# Patient Record
Sex: Female | Born: 1990
Health system: Southern US, Community
[De-identification: ages and names within clinical notes are randomized; demographics above are authoritative.]

## PROBLEM LIST (undated history)

## (undated) DIAGNOSIS — Z789 Other specified health status: Secondary | ICD-10-CM

## (undated) DIAGNOSIS — N289 Disorder of kidney and ureter, unspecified: Secondary | ICD-10-CM

## (undated) DIAGNOSIS — N390 Urinary tract infection, site not specified: Secondary | ICD-10-CM

## (undated) HISTORY — PX: NO PAST SURGERIES: SHX2092

---
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
## 2012-03-18 LAB — OB RESULTS CONSOLE HEPATITIS B SURFACE ANTIGEN: Hepatitis B Surface Ag: NEGATIVE

## 2012-03-18 LAB — OB RESULTS CONSOLE HIV ANTIBODY (ROUTINE TESTING): HIV: NONREACTIVE

## 2012-03-24 NOTE — L&D Delivery Note (Signed)
Delivery Note At 4:25 AM a viable and healthy female was delivered via Vaginal, Spontaneous Delivery (Presentation: ; Occiput Anterior).  APGAR: 8, 9; weight 7 lb 1.4 oz (3215 g).   Placenta status: Intact, Spontaneous Pathology. Short  To path  Cord CANx 1 not reducible: 3 vessels with the following complications: None.  Cord pH: none  Anesthesia: Epidural  Episiotomy: None Lacerations: None Suture Repair: n/a Est. Blood Loss (mL): 300  Mom to postpartum.  Baby to nursery-stable.  Yvette Perez A 09/11/2012, 4:49 AM

## 2012-06-23 LAB — OB RESULTS CONSOLE GBS: GBS: NEGATIVE

## 2012-06-23 LAB — OB RESULTS CONSOLE RUBELLA ANTIBODY, IGM: Rubella: IMMUNE

## 2012-06-23 LAB — OB RESULTS CONSOLE ANTIBODY SCREEN: Antibody Screen: NEGATIVE

## 2012-06-23 LAB — OB RESULTS CONSOLE ABO/RH: RH Type: POSITIVE

## 2012-06-23 LAB — OB RESULTS CONSOLE RPR: RPR: NONREACTIVE

## 2012-09-10 ENCOUNTER — Encounter (HOSPITAL_COMMUNITY): Payer: Self-pay | Admitting: Anesthesiology

## 2012-09-10 ENCOUNTER — Inpatient Hospital Stay (HOSPITAL_COMMUNITY)
Admission: AD | Admit: 2012-09-10 | Discharge: 2012-09-12 | DRG: 373 | Disposition: A | Discharge status: Home / Self Care | Payer: BC Managed Care – PPO | Source: Ambulatory Visit | Attending: Obstetrics and Gynecology | Admitting: Obstetrics and Gynecology

## 2012-09-10 ENCOUNTER — Encounter (HOSPITAL_COMMUNITY): Payer: Self-pay | Admitting: *Deleted

## 2012-09-10 ENCOUNTER — Inpatient Hospital Stay (HOSPITAL_COMMUNITY): Payer: BC Managed Care – PPO | Admitting: Anesthesiology

## 2012-09-10 HISTORY — DX: Other specified health status: Z78.9

## 2012-09-10 LAB — CBC
MCH: 28.4 pg (ref 26.0–34.0)
MCHC: 33.6 g/dL (ref 30.0–36.0)
MCV: 84.6 fL (ref 78.0–100.0)
Platelets: 123 10*3/uL — ABNORMAL LOW (ref 150–400)
RBC: 4.47 MIL/uL (ref 3.87–5.11)

## 2012-09-10 MED ORDER — LACTATED RINGERS IV SOLN
INTRAVENOUS | Status: DC
  Administered 2012-09-10 – 2012-09-11 (×2): via INTRAVENOUS

## 2012-09-10 MED ORDER — IBUPROFEN 600 MG PO TABS: 600.0000 mg | ORAL_TABLET | Freq: Four times a day (QID) | ORAL | Status: DC | PRN

## 2012-09-10 MED ORDER — FENTANYL 2.5 MCG/ML BUPIVACAINE 1/10 % EPIDURAL INFUSION (WH - ANES)
14.0000 mL/h | INTRAMUSCULAR | Status: DC | PRN
  Administered 2012-09-10: 14 mL/h via EPIDURAL
  Filled 2012-09-10: qty 125

## 2012-09-10 MED ORDER — LACTATED RINGERS IV SOLN: 500.0000 mL | INTRAVENOUS | Status: DC | PRN

## 2012-09-10 MED ORDER — PHENYLEPHRINE 40 MCG/ML (10ML) SYRINGE FOR IV PUSH (FOR BLOOD PRESSURE SUPPORT)
80.0000 ug | PREFILLED_SYRINGE | INTRAVENOUS | Status: DC | PRN
  Filled 2012-09-10: qty 2

## 2012-09-10 MED ORDER — OXYTOCIN 40 UNITS IN LACTATED RINGERS INFUSION - SIMPLE MED: 62.5000 mL/h | INTRAVENOUS | Status: DC

## 2012-09-10 MED ORDER — PHENYLEPHRINE 40 MCG/ML (10ML) SYRINGE FOR IV PUSH (FOR BLOOD PRESSURE SUPPORT)
80.0000 ug | PREFILLED_SYRINGE | INTRAVENOUS | Status: DC | PRN
  Filled 2012-09-10: qty 2
  Filled 2012-09-10: qty 5

## 2012-09-10 MED ORDER — CITRIC ACID-SODIUM CITRATE 334-500 MG/5ML PO SOLN: 30.0000 mL | ORAL | Status: DC | PRN

## 2012-09-10 MED ORDER — EPHEDRINE 5 MG/ML INJ
10.0000 mg | INTRAVENOUS | Status: DC | PRN
  Filled 2012-09-10: qty 2

## 2012-09-10 MED ORDER — LIDOCAINE HCL (PF) 1 % IJ SOLN
30.0000 mL | INTRAMUSCULAR | Status: DC | PRN
  Filled 2012-09-10 (×2): qty 30

## 2012-09-10 MED ORDER — LACTATED RINGERS IV SOLN
INTRAVENOUS | Status: DC
  Administered 2012-09-10 – 2012-09-11 (×2): via INTRAUTERINE

## 2012-09-10 MED ORDER — TERBUTALINE SULFATE 1 MG/ML IJ SOLN: 0.2500 mg | Freq: Once | INTRAMUSCULAR | Status: AC | PRN

## 2012-09-10 MED ORDER — OXYCODONE-ACETAMINOPHEN 5-325 MG PO TABS: 1.0000 | ORAL_TABLET | ORAL | Status: DC | PRN

## 2012-09-10 MED ORDER — EPHEDRINE 5 MG/ML INJ
10.0000 mg | INTRAVENOUS | Status: DC | PRN
  Filled 2012-09-10: qty 4
  Filled 2012-09-10: qty 2

## 2012-09-10 MED ORDER — OXYTOCIN BOLUS FROM INFUSION: 500.0000 mL | INTRAVENOUS | Status: DC

## 2012-09-10 MED ORDER — DIPHENHYDRAMINE HCL 50 MG/ML IJ SOLN: 12.5000 mg | INTRAMUSCULAR | Status: DC | PRN

## 2012-09-10 MED ORDER — ONDANSETRON HCL 4 MG/2ML IJ SOLN: 4.0000 mg | Freq: Four times a day (QID) | INTRAMUSCULAR | Status: DC | PRN

## 2012-09-10 MED ORDER — ACETAMINOPHEN 325 MG PO TABS: 650.0000 mg | ORAL_TABLET | ORAL | Status: DC | PRN

## 2012-09-10 MED ORDER — FLEET ENEMA 7-19 GM/118ML RE ENEM: 1.0000 | ENEMA | RECTAL | Status: DC | PRN

## 2012-09-10 MED ORDER — LIDOCAINE HCL (PF) 1 % IJ SOLN
INTRAMUSCULAR | Status: DC | PRN
  Administered 2012-09-10 (×2): 5 mL

## 2012-09-10 MED ORDER — LACTATED RINGERS IV SOLN: 500.0000 mL | Freq: Once | INTRAVENOUS | Status: DC

## 2012-09-10 MED ORDER — OXYTOCIN 40 UNITS IN LACTATED RINGERS INFUSION - SIMPLE MED
1.0000 m[IU]/min | INTRAVENOUS | Status: DC
  Administered 2012-09-10: 2 m[IU]/min via INTRAVENOUS
  Administered 2012-09-10: 1 m[IU]/min via INTRAVENOUS
  Administered 2012-09-10: 6 m[IU]/min via INTRAVENOUS
  Filled 2012-09-10: qty 1000

## 2012-09-10 NOTE — Progress Notes (Signed)
Yvette Perez is Perez 22 y.o. G1P0 at [redacted]w[redacted]d by ultrasound admitted for rupture of membranes  Subjective: No chief complaint on file.  No complaint. Denies pelvic pressure Objective: BP 127/96  Pulse 70  Temp(Src) 97.7 F (36.5 C) (Oral)  Resp 20  Ht 5\' 2"  (1.575 m)  Wt 59.421 kg (131 lb)  BMI 23.95 kg/m2     Pitocin off due to fetal heart rate decel when pitocin was restarted  FHT:  FHR: 150 bpm, variability: moderate,  accelerations:  Present,  decelerations:  Present occ variable decel UC:   irregular, every 2-3 minutes SVE:   4/80/-2 some edema ant lip Labs: Lab Results  Component Value Date   WBC 8.2 09/10/2012   HGB 12.7 09/10/2012   HCT 37.8 09/10/2012   MCV 84.6 09/10/2012   PLT 123* 09/10/2012    Assessment / Plan: Arrest in active phase of labor  P) will cont exaggerated sims position. Hold on pitocin Anticipated MOD:  Poss C/S  Yvette Perez 09/10/2012, 11:26 PM

## 2012-09-10 NOTE — Anesthesia Procedure Notes (Signed)
Epidural Patient location during procedure: OB Start time: 09/10/2012 8:10 PM  Staffing Anesthesiologist: Angus Seller., Harrell Gave. Performed by: anesthesiologist   Preanesthetic Checklist Completed: patient identified, site marked, surgical consent, pre-op evaluation, timeout performed, IV checked, risks and benefits discussed and monitors and equipment checked  Epidural Patient position: sitting Prep: site prepped and draped and DuraPrep Patient monitoring: continuous pulse ox and blood pressure Approach: midline Injection technique: LOR air and LOR saline  Needle:  Needle type: Tuohy  Needle gauge: 17 G Needle length: 9 cm and 9 Needle insertion depth: 4 cm Catheter type: closed end flexible Catheter size: 19 Gauge Catheter at skin depth: 10 cm Test dose: negative  Assessment Events: blood not aspirated, injection not painful, no injection resistance, negative IV test and no paresthesia  Additional Notes Patient identified.  Risk benefits discussed including failed block, incomplete pain control, headache, nerve damage, paralysis, blood pressure changes, nausea, vomiting, reactions to medication both toxic or allergic, and postpartum back pain.  Patient expressed understanding and wished to proceed.  All questions were answered.  Sterile technique used throughout procedure and epidural site dressed with sterile barrier dressing. No paresthesia or other complications noted.The patient did not experience any signs of intravascular injection such as tinnitus or metallic taste in mouth nor signs of intrathecal spread such as rapid motor block. Please see nursing notes for vital signs.

## 2012-09-10 NOTE — H&P (Signed)
Yvette Perez is a 22 y.o. female presenting for admission @ 38 6/7 weeks due to SROM, early labor. GBS cx neg Pt was seen in office with c/o greenish mucoid discharge.? Leaking fluid. (+) ctx q 7 mins Maternal Medical History:  Reason for admission: Rupture of membranes and contractions.   Contractions: Onset was 6-12 hours ago.   Frequency: irregular.   Perceived severity is moderate.    Fetal activity: Perceived fetal activity is normal.    Prenatal complications: Poor weight gain  Prenatal Complications - Diabetes: none.    OB History   Grav Para Term Preterm Abortions TAB SAB Ect Mult Living   1              Past Medical History  Diagnosis Date  . Medical history non-contributory    Past Surgical History  Procedure Laterality Date  . No past surgeries     Family History: family history is not on file. Social History:  reports that she has never smoked. She has never used smokeless tobacco. She reports that she does not drink alcohol or use illicit drugs.   Prenatal Transfer Tool  Maternal Diabetes: No Genetic Screening: Normal Maternal Ultrasounds/Referrals: Normal Fetal Ultrasounds or other Referrals:  None Maternal Substance Abuse:  No Significant Maternal Medications:  None Significant Maternal Lab Results:  Lab values include: Group B Strep negative Other Comments:  None  ROS neg  Dilation: 3 Effacement (%): 80 Station: -2 Exam by:: SH RNC Blood pressure 144/88, pulse 85, temperature 98.3 F (36.8 C), temperature source Oral, resp. rate 20, height 5\' 2"  (1.575 m), weight 59.421 kg (131 lb). Exam Physical Exam  Constitutional: She appears well-developed.  HENT:  Head: Atraumatic.  Eyes: EOM are normal.  Neck: Neck supple.  Cardiovascular: Regular rhythm.   Respiratory: Breath sounds normal.  GI: Soft.  Neurological: She is alert.  Skin: Skin is warm and dry.  Psychiatric: She has a normal mood and affect.    Prenatal labs: ABO, Rh:  A/Positive/-- (06/20 0000) Antibody: Negative (04/02 0000) Rubella: Immune (04/02 0000) RPR: Nonreactive (04/02 0000)  HBsAg: Negative (12/26 0000)  HIV: Non-reactive (12/26 0000)  GBS: Negative (04/02 0000)   Assessment/Plan: SROM Early labor P) admit routine labs. Pitocin augmentation prn   Breannah Kratt A 09/10/2012, 7:19 PM

## 2012-09-10 NOTE — Progress Notes (Signed)
S; notes some painful ctx  O: Pitocin 2 MIU  VE; 4/80/-3 asynclytic Bulging membrane; AROM  Mod mec fluid IUPC/ FSE placed Tracing: baseline 145 (+) accel to 150 Ctx q 2-4 mins  IMP: arrest of dilation 2nd to prob suboptimal ctx SROM IUP@ 38 6/7 P) cont to increase pitocin. Epidural prn. Amnioinfusion. Exaggerated right sims

## 2012-09-10 NOTE — Anesthesia Preprocedure Evaluation (Signed)

## 2012-09-10 NOTE — Progress Notes (Signed)
S; called by RN earlier for 7 min deceleration shortly after epidural . BP was elevl  O: epidural  VE unchanged per RN  Amniotic fluid clearer Tracing reviewed good variability within decels. Baseline 145 (+) accels with pitocin off Ctx q2-3 mins suboptimal strength  IMP: arrest of dilation due to suboptimal ctx Now reassuring fetal tracing P) resume pitocin at 9;30 pm. Cont exaggerated right sims position

## 2012-09-11 ENCOUNTER — Encounter (HOSPITAL_COMMUNITY): Payer: Self-pay | Admitting: *Deleted

## 2012-09-11 MED ORDER — TETANUS-DIPHTH-ACELL PERTUSSIS 5-2.5-18.5 LF-MCG/0.5 IM SUSP: 0.5000 mL | Freq: Once | INTRAMUSCULAR | Status: DC

## 2012-09-11 MED ORDER — SIMETHICONE 80 MG PO CHEW: 80.0000 mg | CHEWABLE_TABLET | ORAL | Status: DC | PRN

## 2012-09-11 MED ORDER — ONDANSETRON HCL 4 MG PO TABS: 4.0000 mg | ORAL_TABLET | ORAL | Status: DC | PRN

## 2012-09-11 MED ORDER — ONDANSETRON HCL 4 MG/2ML IJ SOLN: 4.0000 mg | INTRAMUSCULAR | Status: DC | PRN

## 2012-09-11 MED ORDER — ZOLPIDEM TARTRATE 5 MG PO TABS: 5.0000 mg | ORAL_TABLET | Freq: Every evening | ORAL | Status: DC | PRN

## 2012-09-11 MED ORDER — WITCH HAZEL-GLYCERIN EX PADS: 1.0000 "application " | MEDICATED_PAD | CUTANEOUS | Status: DC | PRN

## 2012-09-11 MED ORDER — DIBUCAINE 1 % RE OINT: 1.0000 "application " | TOPICAL_OINTMENT | RECTAL | Status: DC | PRN

## 2012-09-11 MED ORDER — IBUPROFEN 600 MG PO TABS
600.0000 mg | ORAL_TABLET | Freq: Four times a day (QID) | ORAL | Status: DC
  Administered 2012-09-11 – 2012-09-12 (×3): 600 mg via ORAL
  Filled 2012-09-11 (×4): qty 1

## 2012-09-11 MED ORDER — SENNOSIDES-DOCUSATE SODIUM 8.6-50 MG PO TABS
2.0000 | ORAL_TABLET | Freq: Every day | ORAL | Status: DC
  Administered 2012-09-11: 2 via ORAL

## 2012-09-11 MED ORDER — PRENATAL MULTIVITAMIN CH
1.0000 | ORAL_TABLET | Freq: Every day | ORAL | Status: DC
  Administered 2012-09-11: 1 via ORAL

## 2012-09-11 MED ORDER — DIPHENHYDRAMINE HCL 25 MG PO CAPS: 25.0000 mg | ORAL_CAPSULE | Freq: Four times a day (QID) | ORAL | Status: DC | PRN

## 2012-09-11 MED ORDER — BENZOCAINE-MENTHOL 20-0.5 % EX AERO: 1.0000 "application " | INHALATION_SPRAY | CUTANEOUS | Status: DC | PRN

## 2012-09-11 MED ORDER — OXYCODONE-ACETAMINOPHEN 5-325 MG PO TABS: 1.0000 | ORAL_TABLET | ORAL | Status: DC | PRN

## 2012-09-11 MED ORDER — LANOLIN HYDROUS EX OINT: TOPICAL_OINTMENT | CUTANEOUS | Status: DC | PRN

## 2012-09-11 NOTE — Progress Notes (Signed)
S: called for delivery  O: pitocin 1 MIU since 12:30 am  VE: fully (+) 3 station  Tracing: baseline 150 (+) variables( sm)  IMP: complete P) push

## 2012-09-11 NOTE — Anesthesia Postprocedure Evaluation (Signed)
  Anesthesia Post-op Note  Patient: Yvette Perez  Procedure(s) Performed: * No procedures listed *  Patient Location: Mother/Baby  Anesthesia Type:Epidural  Level of Consciousness: awake and alert   Airway and Oxygen Therapy: Patient Spontanous Breathing  Post-op Pain: mild  Post-op Assessment: Patient's Cardiovascular Status Stable, Respiratory Function Stable, No signs of Nausea or vomiting, Pain level controlled, No headache, No residual numbness and No residual motor weakness  Post-op Vital Signs: Reviewed and stable  Complications: No apparent anesthesia complications

## 2012-09-11 NOTE — Progress Notes (Signed)
Interval Note - PPD #0  Patient resting at present - not disturbed   VS: BP 129/76  Pulse 84  Temp(Src) 98.2 F (36.8 C) (Oral)  Resp 18  Ht 5\' 2"  (1.575 m)  Wt 59.421 kg (131 lb)  BMI 23.95 kg/m2          A: PPD # 0  P: Routine post partum orders    Marlinda Mike CNM, MSN, [facility] 09/11/2012, 10:50 AM

## 2012-09-12 LAB — CBC
HCT: 31 % — ABNORMAL LOW (ref 36.0–46.0)
Hemoglobin: 10.4 g/dL — ABNORMAL LOW (ref 12.0–15.0)
MCV: 84.2 fL (ref 78.0–100.0)
RBC: 3.68 MIL/uL — ABNORMAL LOW (ref 3.87–5.11)
RDW: 14.3 % (ref 11.5–15.5)
WBC: 10.6 10*3/uL — ABNORMAL HIGH (ref 4.0–10.5)

## 2012-09-12 MED ORDER — IBUPROFEN 600 MG PO TABS: 600.0000 mg | ORAL_TABLET | Freq: Four times a day (QID) | ORAL | Status: DC

## 2012-09-12 NOTE — Progress Notes (Signed)
PPD 1 SVD  S:  Reports feeling well - wants DC today             Tolerating po/ No nausea or vomiting             Bleeding is light             Pain controlled with motrin and percocet             Up ad lib / ambulatory / voiding QS  Newborn breast feeding    O:               VS: BP 119/82  Pulse 87  Temp(Src) 97.6 F (36.4 C) (Oral)  Resp 18  Ht 5\' 2"  (1.575 m)  Wt 59.421 kg (131 lb)  BMI 23.95 kg/m2  SpO2 100%   LABS:  Recent Labs  09/10/12 1430 09/12/12 0650  WBC 8.2 10.6*  HGB 12.7 10.4*  PLT 123* 115*                         Physical Exam:             Alert and oriented X3  Abdomen: soft, non-tender, non-distended              Fundus: firm, non-tender, Ueven  Perineum: mild edema  Lochia: light  Extremities: trace edema, no calf pain or tenderness    A: PPD # 1   Doing well - stable status  P:  Routine post partum orders  DC  Marlinda Mike CNM, MSN, FACNM 09/12/2012, 10:14 AM

## 2012-09-12 NOTE — Discharge Summary (Signed)
Obstetric Discharge Summary  Reason for Admission: onset of labor Prenatal Procedures: none Intrapartum Procedures: spontaneous vaginal delivery and pitocin augmentation Postpartum Procedures: none Complications-Operative and Postpartum: none Hemoglobin  Date Value Range Status  09/12/2012 10.4* 12.0 - 15.0 g/dL Final     DELTA CHECK NOTED     REPEATED TO VERIFY     HCT  Date Value Range Status  09/12/2012 31.0* 36.0 - 46.0 % Final    Physical Exam:  General: alert, cooperative and no distress Lochia: appropriate Uterine Fundus: firm DVT Evaluation: No evidence of DVT seen on physical exam.  Discharge Diagnoses: Term Pregnancy-delivered  Discharge Information: Date: 09/12/2012 Activity: pelvic rest Diet: routine Medications: PNV and Ibuprofen Condition: stable Instructions: refer to practice specific booklet Discharge to: home Follow-up Information   Follow up with COUSINS,SHERONETTE A, MD. Schedule an appointment as soon as possible for a visit in 6 weeks.   Contact information:   [address] [place] [address] [phone]       Newborn Data: Live born female  Birth Weight: 7 lb 1.4 oz (3215 g) APGAR: 8, 9  Home with mother.  Marlinda Mike 09/12/2012, 11:28 AM

## 2012-09-13 NOTE — Progress Notes (Signed)
Post discharge chart review completed.  

## 2013-11-15 ENCOUNTER — Encounter (HOSPITAL_BASED_OUTPATIENT_CLINIC_OR_DEPARTMENT_OTHER): Payer: Self-pay | Admitting: Emergency Medicine

## 2013-11-15 ENCOUNTER — Emergency Department (HOSPITAL_BASED_OUTPATIENT_CLINIC_OR_DEPARTMENT_OTHER): Payer: No Typology Code available for payment source

## 2013-11-15 ENCOUNTER — Emergency Department (HOSPITAL_BASED_OUTPATIENT_CLINIC_OR_DEPARTMENT_OTHER)
Admission: EM | Admit: 2013-11-15 | Discharge: 2013-11-15 | Disposition: A | Discharge status: Home / Self Care | Payer: No Typology Code available for payment source | Attending: Emergency Medicine | Admitting: Emergency Medicine

## 2013-11-15 DIAGNOSIS — Z791 Long term (current) use of non-steroidal anti-inflammatories (NSAID): Secondary | ICD-10-CM | POA: Insufficient documentation

## 2013-11-15 DIAGNOSIS — R0789 Other chest pain: Secondary | ICD-10-CM

## 2013-11-15 DIAGNOSIS — Z3202 Encounter for pregnancy test, result negative: Secondary | ICD-10-CM | POA: Diagnosis not present

## 2013-11-15 DIAGNOSIS — R079 Chest pain, unspecified: Secondary | ICD-10-CM | POA: Diagnosis present

## 2013-11-15 DIAGNOSIS — R071 Chest pain on breathing: Secondary | ICD-10-CM | POA: Diagnosis not present

## 2013-11-15 LAB — CBC WITH DIFFERENTIAL/PLATELET
Basophils Absolute: 0 10*3/uL (ref 0.0–0.1)
Basophils Relative: 0 % (ref 0–1)
EOS ABS: 0.1 10*3/uL (ref 0.0–0.7)
Eosinophils Relative: 1 % (ref 0–5)
HEMATOCRIT: 41.4 % (ref 36.0–46.0)
Hemoglobin: 13.6 g/dL (ref 12.0–15.0)
Lymphocytes Relative: 27 % (ref 12–46)
Lymphs Abs: 2.3 10*3/uL (ref 0.7–4.0)
MCH: 26.9 pg (ref 26.0–34.0)
MCHC: 32.9 g/dL (ref 30.0–36.0)
MCV: 81.8 fL (ref 78.0–100.0)
MONO ABS: 0.5 10*3/uL (ref 0.1–1.0)
Monocytes Relative: 6 % (ref 3–12)
Neutro Abs: 5.7 10*3/uL (ref 1.7–7.7)
Neutrophils Relative %: 67 % (ref 43–77)
PLATELETS: 305 10*3/uL (ref 150–400)
RBC: 5.06 MIL/uL (ref 3.87–5.11)
RDW: 13.8 % (ref 11.5–15.5)
WBC: 8.6 10*3/uL (ref 4.0–10.5)

## 2013-11-15 LAB — URINALYSIS, ROUTINE W REFLEX MICROSCOPIC
BILIRUBIN URINE: NEGATIVE
GLUCOSE, UA: NEGATIVE mg/dL
HGB URINE DIPSTICK: NEGATIVE
Ketones, ur: NEGATIVE mg/dL
Leukocytes, UA: NEGATIVE
Nitrite: NEGATIVE
Protein, ur: NEGATIVE mg/dL
SPECIFIC GRAVITY, URINE: 1.026 (ref 1.005–1.030)
Urobilinogen, UA: 0.2 mg/dL (ref 0.0–1.0)
pH: 5.5 (ref 5.0–8.0)

## 2013-11-15 LAB — BASIC METABOLIC PANEL
Anion gap: 15 (ref 5–15)
BUN: 13 mg/dL (ref 6–23)
CALCIUM: 10.5 mg/dL (ref 8.4–10.5)
CO2: 25 mEq/L (ref 19–32)
CREATININE: 0.7 mg/dL (ref 0.50–1.10)
Chloride: 100 mEq/L (ref 96–112)
GFR calc Af Amer: >90 mL/min (ref >90)
GLUCOSE: 138 mg/dL — AB (ref 70–99)
Potassium: 3.6 mEq/L — ABNORMAL LOW (ref 3.7–5.3)
SODIUM: 140 meq/L (ref 137–147)

## 2013-11-15 LAB — PREGNANCY, URINE: PREG TEST UR: NEGATIVE

## 2013-11-15 MED ORDER — NAPROXEN 500 MG PO TABS: 500.0000 mg | ORAL_TABLET | Freq: Two times a day (BID) | ORAL | Status: DC

## 2013-11-15 NOTE — ED Provider Notes (Signed)
CSN: [id number]     Arrival date & time 11/15/13  1641 History   First MD Initiated Contact with Patient 11/15/13 1646     Chief Complaint  Patient presents with  . Chest Pain     (Consider location/radiation/quality/duration/timing/severity/associated sxs/prior Treatment) Patient is a 23 y.o. female presenting with chest pain. The history is provided by the patient.  Chest Pain Associated symptoms: no abdominal pain, no back pain, no cough, no diaphoresis, no dysphagia, no fever, no headache, no nausea, no palpitations, no shortness of breath and not vomiting    patient with acute onset of left-sided the chest pain. Underneath the left breast. Scribed at worse 7/10. Currently 4/10. Made worse with taking a deep breath. Onset was at 9:00 this morning. Patient told the triage that she had a fever 100.5. Patient was not subjectively aware that she had a fever. No injury. Patient had similar type chest pain a year ago with a negative workup. Denies shortness of breath. Denies leg swelling.  Past Medical History  Diagnosis Date  . Medical history non-contributory    Past Surgical History  Procedure Laterality Date  . No past surgeries     No family history on file. History  Substance Use Topics  . Smoking status: Never Smoker   . Smokeless tobacco: Never Used  . Alcohol Use: No   OB History   Grav Para Term Preterm Abortions TAB SAB Ect Mult Living   Review of Systems  Constitutional: Negative for fever, chills and diaphoresis.  HENT: Negative for congestion, sinus pressure, sore throat and trouble swallowing.   Eyes: Negative for redness and visual disturbance.  Respiratory: Negative for cough and shortness of breath.   Cardiovascular: Positive for chest pain. Negative for palpitations and leg swelling.  Gastrointestinal: Negative for nausea, vomiting and abdominal pain.  Genitourinary: Negative for dysuria.  Musculoskeletal: Negative for back pain and neck  pain.  Skin: Negative for rash.  Neurological: Negative for headaches.  Hematological: Does not bruise/bleed easily.  Psychiatric/Behavioral: Negative for confusion.      Allergies  Review of patient's allergies indicates no known allergies.  Home Medications   Prior to Admission medications   Medication Sig Start Date End Date Taking? Authorizing Provider  ibuprofen (ADVIL,MOTRIN) 600 MG tablet Take 1 tablet (600 mg total) by mouth every 6 (six) hours. 09/12/12   Marlinda Mike, CNM  naproxen (NAPROSYN) 500 MG tablet Take 1 tablet (500 mg total) by mouth 2 (two) times daily. 11/15/13   Vanetta Mulders, MD  Prenatal Vit-Fe Fumarate-FA (PRENATAL MULTIVITAMIN) TABS Take 1 tablet by mouth daily at 12 noon.    Historical Provider, MD   BP 123/73  Pulse 95  Temp(Src) 100.5 F (38.1 C) (Oral)  Resp 16  SpO2 100%  LMP 11/15/2013 Physical Exam  Nursing note and vitals reviewed. Constitutional: She is oriented to person, place, and time. She appears well-developed and well-nourished. No distress.  HENT:  Head: Normocephalic and atraumatic.  Mouth/Throat: Oropharynx is clear and moist.  Eyes: Conjunctivae and EOM are normal. Pupils are equal, round, and reactive to light.  Neck: Normal range of motion. Neck supple.  Cardiovascular: Normal rate, regular rhythm and intact distal pulses.   No murmur heard. Pulmonary/Chest: Effort normal and breath sounds normal. No respiratory distress. She has no wheezes. She has no rales. She exhibits no tenderness.  Abdominal: Soft. Bowel sounds are normal. There is no tenderness.  Musculoskeletal: Normal range of motion. She exhibits no edema.  Neurological: She is alert and oriented to person, place, and time. No cranial nerve deficit. She exhibits normal muscle tone. Coordination normal.  Skin: Skin is warm. No rash noted.    ED Course  Procedures (including critical care time) Labs Review Labs Reviewed  BASIC METABOLIC PANEL - Abnormal; Notable  for the following:    Potassium 3.6 (*)    Glucose, Bld 138 (*)    All other components within normal limits  CBC WITH DIFFERENTIAL  PREGNANCY, URINE  URINALYSIS, ROUTINE W REFLEX MICROSCOPIC   Results for orders placed during the hospital encounter of 11/15/13  CBC WITH DIFFERENTIAL      Result Value Ref Range   WBC 8.6  4.0 - 10.5 K/uL   RBC 5.06  3.87 - 5.11 MIL/uL   Hemoglobin 13.6  12.0 - 15.0 g/dL   HCT 16.1  09.6 - 04.5 %   MCV 81.8  78.0 - 100.0 fL   MCH 26.9  26.0 - 34.0 pg   MCHC 32.9  30.0 - 36.0 g/dL   RDW 40.9  81.1 - 91.4 %   Platelets 305  150 - 400 K/uL   Neutrophils Relative % 67  43 - 77 %   Neutro Abs 5.7  1.7 - 7.7 K/uL   Lymphocytes Relative 27  12 - 46 %   Lymphs Abs 2.3  0.7 - 4.0 K/uL   Monocytes Relative 6  3 - 12 %   Monocytes Absolute 0.5  0.1 - 1.0 K/uL   Eosinophils Relative 1  0 - 5 %   Eosinophils Absolute 0.1  0.0 - 0.7 K/uL   Basophils Relative 0  0 - 1 %   Basophils Absolute 0.0  0.0 - 0.1 K/uL  BASIC METABOLIC PANEL      Result Value Ref Range   Sodium 140  137 - 147 mEq/L   Potassium 3.6 (*) 3.7 - 5.3 mEq/L   Chloride 100  96 - 112 mEq/L   CO2 25  19 - 32 mEq/L   Glucose, Bld 138 (*) 70 - 99 mg/dL   BUN 13  6 - 23 mg/dL   Creatinine, Ser 7.82  0.50 - 1.10 mg/dL   Calcium 95.6  8.4 - 21.3 mg/dL   GFR calc non Af Amer >90  >90 mL/min   GFR calc Af Amer >90  >90 mL/min   Anion gap 15  5 - 15  PREGNANCY, URINE      Result Value Ref Range   Preg Test, Ur NEGATIVE  NEGATIVE  URINALYSIS, ROUTINE W REFLEX MICROSCOPIC      Result Value Ref Range   Color, Urine YELLOW  YELLOW   APPearance CLEAR  CLEAR   Specific Gravity, Urine 1.026  1.005 - 1.030   pH 5.5  5.0 - 8.0   Glucose, UA NEGATIVE  NEGATIVE mg/dL   Hgb urine dipstick NEGATIVE  NEGATIVE   Bilirubin Urine NEGATIVE  NEGATIVE   Ketones, ur NEGATIVE  NEGATIVE mg/dL   Protein, ur NEGATIVE  NEGATIVE mg/dL   Urobilinogen, UA 0.2  0.0 - 1.0 mg/dL   Nitrite NEGATIVE  NEGATIVE    Leukocytes, UA NEGATIVE  NEGATIVE     Imaging Review Dg Chest 2 View  11/15/2013   CLINICAL DATA:  Left-sided chest pain with deep inspiration.  EXAM: CHEST  2 VIEW  COMPARISON:  None.  FINDINGS: Cardiomediastinal silhouette unremarkable. Lungs clear. Bronchovascular markings normal. Pulmonary vascularity normal.  No pneumothorax. No pleural effusions. Visualized bony thorax intact.  IMPRESSION: Normal examination.   Electronically Signed   By: Hulan Saas M.D.   On: 11/15/2013 17:45     EKG Interpretation   Date/Time:  Tuesday November 15 2013 16:45:17 EDT Ventricular Rate:  100 PR Interval:  128 QRS Duration: 76 QT Interval:  328 QTC Calculation: 423 R Axis:   79 Text Interpretation:  Normal sinus rhythm Normal ECG No previous ECGs  available Confirmed by Archie Shea  MD, [place] Leap ([id number]) on 11/15/2013 4:48:38  PM      MDM   Final diagnoses:  Chest wall pain    Patient with left-sided under the breast chest wall pain. EKG without any acute cardiac changes. No leukocytosis. Chest x-rays negative for pneumothorax pulmonary edema or pneumonia. Patient had no recent trauma to the area. Patient had similar symptoms a year ago with negative workup. Of interest is today there is a low grade fever 100.5. Patient's review of systems shows no evidence of any other infectious process like upper respiratory infection. Patient's urinalysis is negative for urinary tract infection. The chest x-ray being negative will not empirically start with antibiotics. Patient given precautions. Also clinically not concerned about pulmonary embolus.    Vanetta Mulders, MD 11/15/13 [phone]

## 2013-11-15 NOTE — Discharge Instructions (Signed)
Chest Wall Pain Chest wall pain is pain felt in or around the chest bones and muscles. It may take up to 6 weeks to get better. It may take longer if you are active. Chest wall pain can happen on its own. Other times, things like germs, injury, coughing, or exercise can cause the pain. HOME CARE   Avoid activities that make you tired or cause pain. Try not to use your chest, belly (abdominal), or side muscles. Do not use heavy weights.  Put ice on the sore area.  Put ice in a plastic bag.  Place a towel between your skin and the bag.  Leave the ice on for 15-20 minutes for the first 2 days.  Only take medicine as told by your doctor. GET HELP RIGHT AWAY IF:   You have more pain or are very uncomfortable.  You have a fever.  Your chest pain gets worse.  You have new problems.  You feel sick to your stomach (nauseous) or throw up (vomit).  You start to sweat or feel lightheaded.  You have a cough with mucus (phlegm).  You cough up blood. MAKE SURE YOU:   Understand these instructions.  Will watch your condition.  Will get help right away if you are not doing well or get worse. Document Released: 08/27/2007 Document Revised: 06/02/2011 Document Reviewed: 11/04/2010 [facility] Patient Information 2015 [place], [place]. This information is not intended to replace advice given to you by your health care provider. Make sure you discuss any questions you have with your health care provider.  Take anti-inflammatory medicine as directed. Take it at least for a week. As we discussed you are showing evidence of a low-grade fever. Return for any newer worse symptoms. Otherwise today's workup had no lab abnormalities no abnormality on the chest x-ray no abnormality on EKG.

## 2013-11-15 NOTE — ED Notes (Signed)
Pt. Reports pain in the L chest with reports of more pain with deep inspiration.  NO c/o nausea or vomiting or sweating.

## 2014-01-23 ENCOUNTER — Encounter (HOSPITAL_BASED_OUTPATIENT_CLINIC_OR_DEPARTMENT_OTHER): Payer: Self-pay | Admitting: Emergency Medicine

## 2015-03-16 ENCOUNTER — Emergency Department (HOSPITAL_BASED_OUTPATIENT_CLINIC_OR_DEPARTMENT_OTHER)
Admission: EM | Admit: 2015-03-16 | Discharge: 2015-03-16 | Disposition: A | Discharge status: Home / Self Care | Payer: No Typology Code available for payment source | Attending: Emergency Medicine | Admitting: Emergency Medicine

## 2015-03-16 ENCOUNTER — Encounter (HOSPITAL_BASED_OUTPATIENT_CLINIC_OR_DEPARTMENT_OTHER): Payer: Self-pay | Admitting: *Deleted

## 2015-03-16 DIAGNOSIS — Z79899 Other long term (current) drug therapy: Secondary | ICD-10-CM | POA: Insufficient documentation

## 2015-03-16 DIAGNOSIS — B9689 Other specified bacterial agents as the cause of diseases classified elsewhere: Secondary | ICD-10-CM

## 2015-03-16 DIAGNOSIS — Z3202 Encounter for pregnancy test, result negative: Secondary | ICD-10-CM | POA: Insufficient documentation

## 2015-03-16 DIAGNOSIS — N760 Acute vaginitis: Secondary | ICD-10-CM | POA: Insufficient documentation

## 2015-03-16 DIAGNOSIS — N9412 Deep dyspareunia: Secondary | ICD-10-CM | POA: Insufficient documentation

## 2015-03-16 LAB — URINALYSIS, ROUTINE W REFLEX MICROSCOPIC
Bilirubin Urine: NEGATIVE
GLUCOSE, UA: NEGATIVE mg/dL
HGB URINE DIPSTICK: NEGATIVE
Ketones, ur: NEGATIVE mg/dL
Leukocytes, UA: NEGATIVE
Nitrite: NEGATIVE
Protein, ur: NEGATIVE mg/dL
Specific Gravity, Urine: 1.022 (ref 1.005–1.030)
pH: 6 (ref 5.0–8.0)

## 2015-03-16 LAB — WET PREP, GENITAL
Sperm: NONE SEEN
Trich, Wet Prep: NONE SEEN
Yeast Wet Prep HPF POC: NONE SEEN

## 2015-03-16 LAB — PREGNANCY, URINE: Preg Test, Ur: NEGATIVE

## 2015-03-16 MED ORDER — METRONIDAZOLE 500 MG PO TABS: 500.0000 mg | ORAL_TABLET | Freq: Two times a day (BID) | ORAL | Status: DC

## 2015-03-16 NOTE — ED Provider Notes (Signed)
CSN: [id number]     Arrival date & time 03/16/15  1639 History  By signing my name below, I, Soijett Blue, attest that this documentation has been prepared under the direction and in the presence of Althea Grimmer, PA-C Electronically Signed: Soijett Blue, ED Scribe. 03/16/2015. 6:42 PM.   Chief Complaint  Patient presents with  . Vaginal Pain    The history is provided by the patient. No language interpreter was used.    Yvette Perez is a 24 y.o. female who presents to the Emergency Department complaining of intermittent dyspareunia onset 5 months with a total of 2 episodes. She reports that she doesn't currently have any pain while in the ED. She states that she has not tried any medications for the relief for her symptoms. She denies vaginal odor/discharge, n/v, fevers, and any other symptoms. Pt LMP was last year and she is currently on the Implanon as her contraceptive method.   Past Medical History  Diagnosis Date  . Medical history non-contributory    Past Surgical History  Procedure Laterality Date  . No past surgeries     History reviewed. No pertinent family history. Social History  Substance Use Topics  . Smoking status: Never Smoker   . Smokeless tobacco: Never Used  . Alcohol Use: No   OB History    Gravida Para Term Preterm AB TAB SAB Ectopic Multiple Living   Review of Systems  Constitutional: Negative for fever.  Gastrointestinal: Negative for nausea and vomiting.  Genitourinary: Positive for vaginal pain and dyspareunia. Negative for vaginal bleeding and vaginal discharge.   Ten systems are reviewed and are negative for acute change except as noted in the HPI    Allergies  Review of patient's allergies indicates no known allergies.  Home Medications   Prior to Admission medications   Medication Sig Start Date End Date Taking? Authorizing Provider  ibuprofen (ADVIL,MOTRIN) 600 MG tablet Take 1 tablet (600 mg total) by mouth every 6  (six) hours. 09/12/12   Marlinda Mike, CNM  metroNIDAZOLE (FLAGYL) 500 MG tablet Take 1 tablet (500 mg total) by mouth 2 (two) times daily. 03/16/15   Melton Krebs, PA-C  naproxen (NAPROSYN) 500 MG tablet Take 1 tablet (500 mg total) by mouth 2 (two) times daily. 11/15/13   Vanetta Mulders, MD  Prenatal Vit-Fe Fumarate-FA (PRENATAL MULTIVITAMIN) TABS Take 1 tablet by mouth daily at 12 noon.    Historical Provider, MD   BP 129/86 mmHg  Pulse 85  Temp(Src) 98.2 F (36.8 C)  Resp 16  Ht  (1.575 m)  Wt 137 lb (62.143 kg)  BMI 25.05 kg/m2  SpO2 100% Physical Exam  Constitutional: She is oriented to person, place, and time. She appears well-developed and well-nourished. No distress.  HENT:  Head: Normocephalic and atraumatic.  Eyes: EOM are normal.  Neck: Neck supple.  Cardiovascular: Normal rate, regular rhythm and normal heart sounds.  Exam reveals no gallop and no friction rub.   No murmur heard. Pulmonary/Chest: Effort normal and breath sounds normal. No respiratory distress. She has no wheezes. She has no rales.  Abdominal: Soft. There is no tenderness.  Genitourinary: There is no lesion on the right labia. There is no lesion on the left labia. Cervix exhibits no motion tenderness. Right adnexum displays no tenderness. Left adnexum displays no tenderness. Vaginal discharge found.  Chaperone present for exam.   Musculoskeletal: Normal range of motion.  Neurological: She is alert and oriented to person, place, and time.  Skin: Skin is warm and dry.  Psychiatric: She has a normal mood and affect. Her behavior is normal.  Nursing note and vitals reviewed.   ED Course  Procedures  Pelvic exam   DIAGNOSTIC STUDIES: Oxygen Saturation is 100% on RA, nl by my interpretation.    COORDINATION OF CARE: 6:42 PM Discussed treatment plan with pt at bedside which includes UA, pelvic exam, wet prep, RPR, HIV antibody, and pt agreed to plan.    Labs Review Labs Reviewed  WET  PREP, GENITAL - Abnormal; Notable for the following:    Clue Cells Wet Prep HPF POC PRESENT (*)    WBC, Wet Prep HPF POC MANY (*)    All other components within normal limits  PREGNANCY, URINE  URINALYSIS, ROUTINE W REFLEX MICROSCOPIC (NOT AT [facility])  RPR  HIV ANTIBODY (ROUTINE TESTING)  GC/CHLAMYDIA PROBE AMP ([place]) NOT AT [facility]    MDM   Final diagnoses:  Bacterial vaginosis   Patient non-toxic appearing and VSS. Pelvic exam unremarkable.   Wet prep demonstrates Clue Cells. Will treat and have patient followup with gynecology. Patient may be safely discharged home. Discussed reasons for return. Patient in understanding and agreement with the plan.   I personally performed the services described in this documentation, which was scribed in my presence. The recorded information has been reviewed and is accurate.     Melton KrebsSamantha Nicole Lawrance Wiedemann, PA-C 03/19/15 0131  Jerelyn ScottMartha Linker, MD 03/20/15 [phone]

## 2015-03-16 NOTE — Discharge Instructions (Signed)
[name],  Yvette Perez meeting you! Please follow-up with your gynecologist. Return to the emergency department if you develop fevers, abdominal pain, or have increasing pain. Feel better soon!  S. Lane HackerNicole Eura Mccauslin, PA-C   Bacterial Vaginosis Bacterial vaginosis is an infection of the vagina. It happens when too many germs (bacteria) grow in the vagina. Having this infection puts you at risk for getting other infections from sex. Treating this infection can help lower your risk for other infections, such as:   Chlamydia.  Gonorrhea.  HIV.  Herpes. HOME CARE  Take your medicine as told by your doctor.  Finish your medicine even if you start to feel better.  Tell your sex partner that you have an infection. They should see their doctor for treatment.  During treatment:  Avoid sex or use condoms correctly.  Do not douche.  Do not drink alcohol unless your doctor tells you it is ok.  Do not breastfeed unless your doctor tells you it is ok. GET HELP IF:  You are not getting better after 3 days of treatment.  You have more grey fluid (discharge) coming from your vagina than before.  You have more pain than before.  You have a fever. MAKE SURE YOU:   Understand these instructions.  Will watch your condition.  Will get help right away if you are not doing well or get worse.   This information is not intended to replace advice given to you by your health care provider. Make sure you discuss any questions you have with your health care provider.   Document Released: 12/18/2007 Document Revised: 03/31/2014 Document Reviewed: 10/20/2012 Elsevier Interactive Patient Education [employer] Inc[employer]

## 2015-03-16 NOTE — ED Notes (Signed)
Pt c/o vaginal pain with intercourse x 5 months , denies vaginal discharge.

## 2015-03-16 NOTE — ED Notes (Signed)
Chart and VS reviewed

## 2015-03-16 NOTE — ED Notes (Signed)
Pt presents states has increased pain during intercourse. Describes as "painful". Denies any discharge or bleeding during or after intercourse.

## 2015-03-16 NOTE — ED Notes (Signed)
Pt gown and warm blanket provided

## 2015-03-18 LAB — RPR: RPR: NONREACTIVE

## 2015-03-18 LAB — HIV ANTIBODY (ROUTINE TESTING W REFLEX): HIV Screen 4th Generation wRfx: NONREACTIVE

## 2015-03-23 LAB — GC/CHLAMYDIA PROBE AMP (~~LOC~~) NOT AT ARMC
Chlamydia: NEGATIVE
Neisseria Gonorrhea: NEGATIVE

## 2015-04-01 ENCOUNTER — Emergency Department (HOSPITAL_BASED_OUTPATIENT_CLINIC_OR_DEPARTMENT_OTHER): Payer: No Typology Code available for payment source

## 2015-04-01 ENCOUNTER — Encounter (HOSPITAL_BASED_OUTPATIENT_CLINIC_OR_DEPARTMENT_OTHER): Payer: Self-pay | Admitting: *Deleted

## 2015-04-01 ENCOUNTER — Emergency Department (HOSPITAL_BASED_OUTPATIENT_CLINIC_OR_DEPARTMENT_OTHER)
Admission: EM | Admit: 2015-04-01 | Discharge: 2015-04-01 | Disposition: A | Discharge status: Home / Self Care | Payer: No Typology Code available for payment source | Attending: Physician Assistant | Admitting: Physician Assistant

## 2015-04-01 DIAGNOSIS — W000XXA Fall on same level due to ice and snow, initial encounter: Secondary | ICD-10-CM | POA: Insufficient documentation

## 2015-04-01 DIAGNOSIS — Y998 Other external cause status: Secondary | ICD-10-CM | POA: Insufficient documentation

## 2015-04-01 DIAGNOSIS — Z79899 Other long term (current) drug therapy: Secondary | ICD-10-CM | POA: Insufficient documentation

## 2015-04-01 DIAGNOSIS — Y9389 Activity, other specified: Secondary | ICD-10-CM | POA: Insufficient documentation

## 2015-04-01 DIAGNOSIS — Z791 Long term (current) use of non-steroidal anti-inflammatories (NSAID): Secondary | ICD-10-CM | POA: Insufficient documentation

## 2015-04-01 DIAGNOSIS — S62002A Unspecified fracture of navicular [scaphoid] bone of left wrist, initial encounter for closed fracture: Secondary | ICD-10-CM | POA: Insufficient documentation

## 2015-04-01 DIAGNOSIS — Y9289 Other specified places as the place of occurrence of the external cause: Secondary | ICD-10-CM | POA: Insufficient documentation

## 2015-04-01 MED ORDER — IBUPROFEN 800 MG PO TABS: 800.0000 mg | ORAL_TABLET | Freq: Three times a day (TID) | ORAL | Status: DC

## 2015-04-01 NOTE — Discharge Instructions (Signed)
Pelase follow up with ortho in one week to re-xray your hand.  Keeep in splint until then.   Scaphoid Fracture, Wrist A fracture is a break in the bone. The bone you have broken often does not show up as a fracture on x-ray until later on in the healing phase. This bone is called the scaphoid bone. With this bone, your caregiver will often cast or splint your wrist as though it is fractured, even if a fracture is not seen on the x-ray. This is often done with wrist injuries in which there is tenderness at the base of the thumb. An x-ray at 1-3 weeks after your injury may confirm this fracture. A cast or splint is used to protect and keep your injured bone in good position for healing. The cast or splint will be on generally for about 6 to 16 weeks, depending on your health, age, the fracture location and how quickly you heal. Another name for the scaphoid bone is the navicular bone. HOME CARE INSTRUCTIONS   To lessen the swelling and pain, keep the injured part elevated above your heart while sitting or lying down.  Apply ice to the injury for 15-20 minutes, 03-04 times per day while awake, for 2 days. Put the ice in a plastic bag and place a thin towel between the bag of ice and your cast.  If you have a plaster or fiberglass cast or splint:  Do not try to scratch the skin under the cast using sharp or pointed objects.  Check the skin around the cast every day. You may put lotion on any red or sore areas.  Keep your cast or splint dry and clean.  If you have a plaster splint:  Wear the splint as directed.  You may loosen the elastic bandage around the splint if your fingers become numb, tingle, or turn cold or blue.  If you have been put in a removable splint, wear and use as directed.  Do not put pressure on any part of your cast or splint; it may deform or break. Rest your cast or splint only on a pillow the first 24 hours until it is fully hardened.  Your cast or splint can be  protected during bathing with a plastic bag. Do not lower the cast or splint into water.  Only take over-the-counter or prescription medicines for pain, discomfort, or fever as directed by your caregiver.  If your caregiver has given you a follow up appointment, it is very important to keep that appointment. Not keeping the appointment could result in chronic pain and decreased function. If there is any problem keeping the appointment, you must call back to this facility for assistance. SEEK IMMEDIATE MEDICAL CARE IF:   Your cast gets damaged, wet or breaks.  You have continued severe pain or more swelling than you did before the cast or splint was put on.  Your skin or nails below the injury turn blue or gray, or feel cold or numb.  You have tingling or burning pain in your fingers or increasing pain with movement of your fingers   This information is not intended to replace advice given to you by your health care provider. Make sure you discuss any questions you have with your health care provider.   Document Released: 02/28/2002 Document Revised: 06/02/2011 Document Reviewed: 09/20/2014 Elsevier Interactive Patient Education 2016 Elsevier Inc.  Cast or Splint Care Casts and splints support injured limbs and keep bones from moving while they heal.  HOME CARE  Keep the cast or splint uncovered during the drying period.  A plaster cast can take 24 to 48 hours to dry.  A fiberglass cast will dry in less than 1 hour.  Do not rest the cast on anything harder than a pillow for 24 hours.  Do not put weight on your injured limb. Do not put pressure on the cast. Wait for your doctor's approval.  Keep the cast or splint dry.  Cover the cast or splint with a plastic bag during baths or wet weather.  If you have a cast over your chest and belly (trunk), take sponge baths until the cast is taken off.  If your cast gets wet, dry it with a towel or blow dryer. Use the cool setting on the  blow dryer.  Keep your cast or splint clean. Wash a dirty cast with a damp cloth.  Do not put any objects under your cast or splint.  Do not scratch the skin under the cast with an object. If itching is a problem, use a blow dryer on a cool setting over the itchy area.  Do not trim or cut your cast.  Do not take out the padding from inside your cast.  Exercise your joints near the cast as told by your doctor.  Raise (elevate) your injured limb on 1 or 2 pillows for the first 1 to 3 days. GET HELP IF:  Your cast or splint cracks.  Your cast or splint is too tight or too loose.  You itch badly under the cast.  Your cast gets wet or has a soft spot.  You have a bad smell coming from the cast.  You get an object stuck under the cast.  Your skin around the cast becomes red or sore.  You have new or more pain after the cast is put on. GET HELP RIGHT AWAY IF:  You have fluid leaking through the cast.  You cannot move your fingers or toes.  Your fingers or toes turn blue or white or are cool, painful, or puffy (swollen).  You have tingling or lose feeling (numbness) around the injured area.  You have bad pain or pressure under the cast.  You have trouble breathing or have shortness of breath.  You have chest pain.   This information is not intended to replace advice given to you by your health care provider. Make sure you discuss any questions you have with your health care provider.   Document Released: 07/10/2010 Document Revised: 11/10/2012 Document Reviewed: 09/16/2012 Elsevier Interactive Patient Education [employer]

## 2015-04-01 NOTE — ED Notes (Signed)
Reports fell on ice and landed on left wrist

## 2015-04-01 NOTE — ED Provider Notes (Signed)
CSN: [id number]     Arrival date & time 04/01/15  1323 History   First MD Initiated Contact with Patient 04/01/15 1348     Chief Complaint  Patient presents with  . Wrist Pain     (Consider location/radiation/quality/duration/timing/severity/associated sxs/prior Treatment) HPI  Patient is a 25 year old female presenting after fall on the ice onto her outstretched left hand. Patient has pain in her left wrist. X-ray ordered at triage. No abrasions no breaking the skin. No significant swelling.   Past Medical History  Diagnosis Date  . Medical history non-contributory    Past Surgical History  Procedure Laterality Date  . No past surgeries     No family history on file. Social History  Substance Use Topics  . Smoking status: Never Smoker   . Smokeless tobacco: Never Used  . Alcohol Use: No   OB History    Gravida Para Term Preterm AB TAB SAB Ectopic Multiple Living   1 1 1       1      Review of Systems  Constitutional: Negative for activity change.  Respiratory: Negative for shortness of breath.   Cardiovascular: Negative for chest pain.  Gastrointestinal: Negative for abdominal pain.  Musculoskeletal: Positive for arthralgias.      Allergies  Review of patient's allergies indicates no known allergies.  Home Medications   Prior to Admission medications   Medication Sig Start Date End Date Taking? Authorizing Provider  ibuprofen (ADVIL,MOTRIN) 600 MG tablet Take 1 tablet (600 mg total) by mouth every 6 (six) hours. 09/12/12   Marlinda Mikeanya Bailey, CNM  metroNIDAZOLE (FLAGYL) 500 MG tablet Take 1 tablet (500 mg total) by mouth 2 (two) times daily. 03/16/15   Melton KrebsSamantha Nicole Riley, PA-C  naproxen (NAPROSYN) 500 MG tablet Take 1 tablet (500 mg total) by mouth 2 (two) times daily. 11/15/13   Vanetta MuldersScott Zackowski, MD  Prenatal Vit-Fe Fumarate-FA (PRENATAL MULTIVITAMIN) TABS Take 1 tablet by mouth daily at 12 noon.    Historical Provider, MD   BP 136/84 mmHg  Pulse 73  Temp(Src)  98.5 F (36.9 C) (Oral)  Resp 16  Ht 5\' 2"  (1.575 m)  Wt 130 lb (58.968 kg)  BMI 23.77 kg/m2  SpO2 100%  Breastfeeding? No Physical Exam  Constitutional: She is oriented to person, place, and time. She appears well-developed and well-nourished.  HENT:  Head: Normocephalic and atraumatic.  Eyes: Conjunctivae are normal. Right eye exhibits no discharge.  Neck: Neck supple.  Cardiovascular:  No murmur heard. Pulmonary/Chest: Effort normal.  Musculoskeletal: Normal range of motion.  Left wrist with normal range of motion. Tenderness to the scaphoid bone in the snuffbox.  Neurological: She is oriented to person, place, and time. No cranial nerve deficit.  Skin: Skin is warm and dry. No rash noted. She is not diaphoretic.  No abrasions or cuts the skin.  Psychiatric: She has a normal mood and affect. Her behavior is normal.  Nursing note and vitals reviewed.   ED Course  Procedures (including critical care time) Labs Review Labs Reviewed - No data to display  Imaging Review Dg Wrist Complete Left  04/01/2015  CLINICAL DATA:  Fall on left wrist today, pain and bruising. EXAM: LEFT WRIST - COMPLETE 3+ VIEW COMPARISON:  None. FINDINGS: There is no evidence of fracture or dislocation. There is no evidence of arthropathy or other focal bone abnormality. Soft tissues are unremarkable. IMPRESSION: Negative. Electronically Signed   By: Bary RichardStan  Maynard M.D.   On: 04/01/2015 14:11   I have  personally reviewed and evaluated these images and lab results as part of my medical decision-making.   EKG Interpretation None      MDM   Final diagnoses:  None    Patient is a 25 year old female presenting with fall on outstretched hand. Patient has negative x-ray. Full range of motion. Does have scaphoid tenderness. We'll put in a thumb spica splint and have follow-up with orthopedics.  Patient expresses understanding about return precautions.l    Gianina Olinde Randall An, MD 04/01/15 1423

## 2015-11-02 ENCOUNTER — Encounter (HOSPITAL_BASED_OUTPATIENT_CLINIC_OR_DEPARTMENT_OTHER): Payer: Self-pay | Admitting: *Deleted

## 2015-11-02 ENCOUNTER — Emergency Department (HOSPITAL_BASED_OUTPATIENT_CLINIC_OR_DEPARTMENT_OTHER)
Admission: EM | Admit: 2015-11-02 | Discharge: 2015-11-02 | Disposition: A | Discharge status: Home / Self Care | Payer: BLUE CROSS/BLUE SHIELD | Attending: Emergency Medicine | Admitting: Emergency Medicine

## 2015-11-02 DIAGNOSIS — F17210 Nicotine dependence, cigarettes, uncomplicated: Secondary | ICD-10-CM | POA: Insufficient documentation

## 2015-11-02 DIAGNOSIS — J029 Acute pharyngitis, unspecified: Secondary | ICD-10-CM | POA: Diagnosis present

## 2015-11-02 LAB — MONONUCLEOSIS SCREEN: Mono Screen: NEGATIVE

## 2015-11-02 LAB — RAPID STREP SCREEN (MED CTR MEBANE ONLY): STREPTOCOCCUS, GROUP A SCREEN (DIRECT): NEGATIVE

## 2015-11-02 MED ORDER — PREDNISONE 50 MG PO TABS
60.0000 mg | ORAL_TABLET | Freq: Once | ORAL | Status: AC
  Administered 2015-11-02: 60 mg via ORAL
  Filled 2015-11-02: qty 1

## 2015-11-02 MED ORDER — ACETAMINOPHEN 500 MG PO TABS
1000.0000 mg | ORAL_TABLET | Freq: Once | ORAL | Status: AC
  Administered 2015-11-02: 1000 mg via ORAL
  Filled 2015-11-02: qty 2

## 2015-11-02 MED ORDER — PREDNISONE 50 MG PO TABS: ORAL_TABLET | ORAL | 0 refills | Status: DC

## 2015-11-02 MED FILL — predniSONE 50 MG TABS: 50 | 5 days supply | Qty: 5 | Fill #0

## 2015-11-02 NOTE — ED Triage Notes (Signed)
Patient states she developed sore throat, headache, bodyaches, sweats, and chills for the last five days.  Using Dayquil and aspirin with minimal relief.

## 2015-11-02 NOTE — Discharge Instructions (Signed)
Your strep and mononucleosis tests are negative today, your sore throat and headache are likely coming from a viral infection. You need to rest and drink plenty of water.  For pain control you may take:  800mg  of ibuprofen (that is usually 4 over the counter pills)  3 times a day (take with food) and acetaminophen 975mg  (this is 3 over the counter pills) four times a day. Do not drink alcohol or combine with other medications that have acetaminophen as an ingredient (Read the labels!).    Do not hesitate to return to the emergency room for any new, worsening or concerning symptoms.  Please obtain primary care using resource guide below. Let them know that you were seen in the emergency room and that they will need to obtain records for further outpatient management.  Marland Kitchen.reso

## 2015-11-02 NOTE — ED Provider Notes (Signed)
[facility]   CSN: [id number] Arrival date & time: 11/02/15  1031  First Provider Contact:  First MD Initiated Contact with Patient 11/02/15 1032        History   Chief Complaint Chief Complaint  Patient presents with  . Sore Throat    HPI  Blood pressure 118/91, pulse 86, temperature 98.4 F (36.9 C), temperature source Oral, resp. rate 18, height 5' 2.5" (1.588 m), weight 56.7 kg, SpO2 100 %.  Yvette Perez is a 25 y.o. female complaining of sore throat, fatigue, myalgia, headache onset 4 days ago. Patient has positive sick contact in someone at school who was diagnosed with strep recently. She denies fever, chill, nausea, vomiting, rash, sore neck, nausea, vomiting, decreased by mouth intake.  HPI  Past Medical History:  Diagnosis Date  . Medical history non-contributory     Patient Active Problem List   Diagnosis Date Noted  . SVD (spontaneous vaginal delivery) 09/11/2012  . Postpartum care following vaginal delivery (6/21) 09/11/2012    Past Surgical History:  Procedure Laterality Date  . NO PAST SURGERIES      OB History    Gravida Para Term Preterm AB Living   1 1 1     1    SAB TAB Ectopic Multiple Live Births           1       Home Medications    Prior to Admission medications   Medication Sig Start Date End Date Taking? Authorizing Provider  ibuprofen (ADVIL,MOTRIN) 600 MG tablet Take 1 tablet (600 mg total) by mouth every 6 (six) hours. 09/12/12   Marlinda Mikeanya Bailey, CNM  ibuprofen (ADVIL,MOTRIN) 800 MG tablet Take 1 tablet (800 mg total) by mouth 3 (three) times daily. 04/01/15   Courteney Lyn Mackuen, MD  metroNIDAZOLE (FLAGYL) 500 MG tablet Take 1 tablet (500 mg total) by mouth 2 (two) times daily. 03/16/15   Melton KrebsSamantha Rozann Holts Riley, PA-C  naproxen (NAPROSYN) 500 MG tablet Take 1 tablet (500 mg total) by mouth 2 (two) times daily. 11/15/13   Vanetta MuldersScott Zackowski, MD  predniSONE (DELTASONE) 50 MG tablet Take 1 tablet daily with breakfast  11/02/15   Wynetta EmeryNicole Park Beck, PA-C  Prenatal Vit-Fe Fumarate-FA (PRENATAL MULTIVITAMIN) TABS Take 1 tablet by mouth daily at 12 noon.    Historical Provider, MD    Family History No family history on file.  Social History Social History  Substance Use Topics  . Smoking status: Current Every Day Smoker    Packs/day: 1.00    Types: Cigars  . Smokeless tobacco: Never Used  . Alcohol use No     Allergies   Review of patient's allergies indicates no known allergies.   Review of Systems Review of Systems  10 systems reviewed and found to be negative, except as noted in the HPI.   Physical Exam Updated Vital Signs BP 118/91 (BP Location: Left Arm)   Pulse 86   Temp 98.4 F (36.9 C) (Oral)   Resp 18   Ht 5' 2.5" (1.588 m)   Wt 56.7 kg   LMP  (Exact Date)   SpO2 100%   BMI 22.50 kg/m   Physical Exam  Constitutional: She is oriented to person, place, and time. She appears well-developed and well-nourished. No distress.  HENT:  Head: Normocephalic and atraumatic.  Right Ear: External ear normal.  Left Ear: External ear normal.  Mouth/Throat: Oropharynx is clear and moist. No oropharyngeal exudate.  No drooling or stridor. Posterior pharynx mildly  erythematous no significant tonsillar hypertrophy. No exudate. Soft palate rises symmetrically. No TTP or induration under tongue.   No tenderness to palpation of frontal or bilateral maxillary sinuses.  Mild mucosal edema in the nares with scant rhinorrhea.  Bilateral tympanic membranes with normal architecture and good light reflex.    Eyes: Conjunctivae and EOM are normal. Pupils are equal, round, and reactive to light.  Neck: Normal range of motion. Neck supple.  Tender anterior cervical lymphadenopathy  Cardiovascular: Normal rate, regular rhythm and intact distal pulses.   Pulmonary/Chest: Effort normal and breath sounds normal. No stridor. No respiratory distress. She has no wheezes. She has no rales. She exhibits no  tenderness.  Abdominal: Soft. There is no tenderness. There is no rebound and no guarding.  Musculoskeletal: Normal range of motion.  Lymphadenopathy:    She has cervical adenopathy.  Neurological: She is alert and oriented to person, place, and time.  Skin: She is not diaphoretic.  Psychiatric: She has a normal mood and affect.  Nursing note and vitals reviewed.    ED Treatments / Results  Labs (all labs ordered are listed, but only abnormal results are displayed) Labs Reviewed  RAPID STREP SCREEN (NOT AT [facility])  CULTURE, GROUP A STREP [facility])  MONONUCLEOSIS SCREEN    EKG  EKG Interpretation None       Radiology No results found.  Procedures Procedures (including critical care time)  Medications Ordered in ED Medications  predniSONE (DELTASONE) tablet 60 mg (60 mg Oral Given 11/02/15 1122)  acetaminophen (TYLENOL) tablet 1,000 mg (1,000 mg Oral Given 11/02/15 1122)     Initial Impression / Assessment and Plan / ED Course  I have reviewed the triage vital signs and the nursing notes.  Pertinent labs & imaging results that were available during my care of the patient were reviewed by me and considered in my medical decision making (see chart for details).  Clinical Course    Vitals:   11/02/15 1042  BP: 118/91  Pulse: 86  Resp: 18  Temp: 98.4 F (36.9 C)  TempSrc: Oral  SpO2: 100%  Weight: 56.7 kg  Height: 5' 2.5" (1.588 m)    Medications  predniSONE (DELTASONE) tablet 60 mg (60 mg Oral Given 11/02/15 1122)  acetaminophen (TYLENOL) tablet 1,000 mg (1,000 mg Oral Given 11/02/15 1122)    Yvette Perez is 25 y.o. female presenting with Sore throat and headache onset 4 days ago, positive sick contact. Patient is nontoxic appearing, afebrile with stable vital signs. Physical exam significant only for mild tonsillar hypertrophy and anterior cervical lymphadenopathy. Will test for strep and mono. Patient given prednisone and acetaminophen for comfort.  Rapid  strep and mono are negative. Patient given work note and advised to push fluids.  Evaluation does not show pathology that would require ongoing emergent intervention or inpatient treatment. Pt is hemodynamically stable and mentating appropriately. Discussed findings and plan with patient/guardian, who agrees with care plan. All questions answered. Return precautions discussed and outpatient follow up given.      Final Clinical Impressions(s) / ED Diagnoses   Final diagnoses:  Viral pharyngitis    New Prescriptions Discharge Medication List as of 11/02/2015 12:24 PM    START taking these medications   Details  predniSONE (DELTASONE) 50 MG tablet Take 1 tablet daily with breakfast, Print         Wynetta Emery, PA-C 11/02/15 1413    Maia Plan, MD 11/02/15 1933

## 2015-11-04 LAB — CULTURE, GROUP A STREP (THRC)

## 2015-12-09 ENCOUNTER — Emergency Department (HOSPITAL_BASED_OUTPATIENT_CLINIC_OR_DEPARTMENT_OTHER)
Admission: EM | Admit: 2015-12-09 | Discharge: 2015-12-10 | Disposition: A | Discharge status: Home / Self Care | Payer: BLUE CROSS/BLUE SHIELD | Attending: Emergency Medicine | Admitting: Emergency Medicine

## 2015-12-09 ENCOUNTER — Encounter (HOSPITAL_BASED_OUTPATIENT_CLINIC_OR_DEPARTMENT_OTHER): Payer: Self-pay | Admitting: Emergency Medicine

## 2015-12-09 DIAGNOSIS — F17290 Nicotine dependence, other tobacco product, uncomplicated: Secondary | ICD-10-CM | POA: Diagnosis not present

## 2015-12-09 DIAGNOSIS — J029 Acute pharyngitis, unspecified: Secondary | ICD-10-CM | POA: Insufficient documentation

## 2015-12-09 DIAGNOSIS — R1013 Epigastric pain: Secondary | ICD-10-CM | POA: Insufficient documentation

## 2015-12-09 DIAGNOSIS — R109 Unspecified abdominal pain: Secondary | ICD-10-CM | POA: Diagnosis present

## 2015-12-09 LAB — URINE MICROSCOPIC-ADD ON

## 2015-12-09 LAB — URINALYSIS, ROUTINE W REFLEX MICROSCOPIC
Bilirubin Urine: NEGATIVE
GLUCOSE, UA: NEGATIVE mg/dL
Ketones, ur: 40 mg/dL — AB
Leukocytes, UA: NEGATIVE
Nitrite: NEGATIVE
PROTEIN: NEGATIVE mg/dL
Specific Gravity, Urine: 1.025 (ref 1.005–1.030)
pH: 6 (ref 5.0–8.0)

## 2015-12-09 LAB — PREGNANCY, URINE: Preg Test, Ur: NEGATIVE

## 2015-12-09 LAB — RAPID STREP SCREEN (MED CTR MEBANE ONLY): Streptococcus, Group A Screen (Direct): POSITIVE — AB

## 2015-12-09 MED ORDER — ACETAMINOPHEN 500 MG PO TABS
1000.0000 mg | ORAL_TABLET | Freq: Once | ORAL | Status: AC
  Administered 2015-12-10: 1000 mg via ORAL
  Filled 2015-12-09: qty 2

## 2015-12-09 MED ORDER — DEXAMETHASONE SODIUM PHOSPHATE 10 MG/ML IJ SOLN
10.0000 mg | Freq: Once | INTRAMUSCULAR | Status: AC
  Administered 2015-12-10: 10 mg via INTRAMUSCULAR
  Filled 2015-12-09: qty 1

## 2015-12-09 MED ORDER — PENICILLIN G BENZATHINE 1200000 UNIT/2ML IM SUSP
1.2000 10*6.[IU] | Freq: Once | INTRAMUSCULAR | Status: AC
  Administered 2015-12-10: 1.2 10*6.[IU] via INTRAMUSCULAR
  Filled 2015-12-09: qty 2

## 2015-12-09 NOTE — ED Provider Notes (Signed)
[facility] Note   CSN: [id number] Arrival date & time: 12/09/15  2220  By signing my name below, I, Bridgette HabermannMaria Tan, attest that this documentation has been prepared under the direction and in the presence of Tomasita CrumbleAdeleke Malakhai Beitler, MD. Electronically Signed: Bridgette HabermannMaria Tan, ED Scribe. 12/09/15. 11:49 PM.  History   Chief Complaint Chief Complaint  Patient presents with  . Abdominal Pain  . Sore Throat   HPI Comments: Yvette Perez is a 25 y.o. female who presents to the Emergency Department complaining of 10/10, constant sore throat onset 3 days ago. Pt reports "white stuff" in the back of her throat. She had h/o similar symptoms one month ago which was diagnosed as a viral infection. Pt states pain is exacerbated with swallowing. Pt is also complaining of mid abdominal pain onset 2 days ago. She thinks it might've been a medicine she took for her sinuses. No alleviating factors noted. Pt denies h/o GERD. Pt denies fever, vomiting, diarrhea, or any other associated symptoms.   The history is provided by the patient. No language interpreter was used.    Past Medical History:  Diagnosis Date  . Medical history non-contributory     Patient Active Problem List   Diagnosis Date Noted  . SVD (spontaneous vaginal delivery) 09/11/2012  . Postpartum care following vaginal delivery (6/21) 09/11/2012    Past Surgical History:  Procedure Laterality Date  . NO PAST SURGERIES      OB History    Gravida Para Term Preterm AB Living   1 1 1     1    SAB TAB Ectopic Multiple Live Births           1       Home Medications    Prior to Admission medications   Medication Sig Start Date End Date Taking? Authorizing Provider  ibuprofen (ADVIL,MOTRIN) 600 MG tablet Take 1 tablet (600 mg total) by mouth every 6 (six) hours. 09/12/12   Marlinda Mikeanya Bailey, CNM  ibuprofen (ADVIL,MOTRIN) 800 MG tablet Take 1 tablet (800 mg total) by mouth 3 (three) times daily. 04/01/15   Courteney Lyn Mackuen, MD    metroNIDAZOLE (FLAGYL) 500 MG tablet Take 1 tablet (500 mg total) by mouth 2 (two) times daily. 03/16/15   Melton KrebsSamantha Nicole Riley, PA-C  naproxen (NAPROSYN) 500 MG tablet Take 1 tablet (500 mg total) by mouth 2 (two) times daily. 11/15/13   Vanetta MuldersScott Zackowski, MD  predniSONE (DELTASONE) 50 MG tablet Take 1 tablet daily with breakfast 11/02/15   Wynetta EmeryNicole Pisciotta, PA-C  Prenatal Vit-Fe Fumarate-FA (PRENATAL MULTIVITAMIN) TABS Take 1 tablet by mouth daily at 12 noon.    Historical Provider, MD    Family History History reviewed. No pertinent family history.  Social History Social History  Substance Use Topics  . Smoking status: Current Every Day Smoker    Packs/day: 1.00    Types: Cigars  . Smokeless tobacco: Never Used  . Alcohol use No     Allergies   Review of patient's allergies indicates no known allergies.   Review of Systems Review of Systems 10 Systems reviewed and all are negative for acute change except as noted in the HPI. Physical Exam Updated Vital Signs BP 121/92 (BP Location: Left Arm)   Pulse 78   Temp 98.3 F (36.8 C) (Oral)   Resp 18   Ht 5\' 2"  (1.575 m)   Wt 130 lb (59 kg)   SpO2 100%   BMI 23.78 kg/m   Physical Exam  Constitutional:  She is oriented to person, place, and time. She appears well-developed and well-nourished. No distress.  HENT:  Head: Normocephalic and atraumatic.  Nose: Nose normal.  Mouth/Throat: Uvula is midline and oropharynx is clear and moist. No oropharyngeal exudate. Tonsillar exudate.  Tonsillar exudate on the right side. No tonsillar swelling. No uvular deviation.  Eyes: Conjunctivae and EOM are normal. Pupils are equal, round, and reactive to light. No scleral icterus.  Neck: Normal range of motion. Neck supple. No JVD present. No tracheal deviation present. No thyromegaly present.  Cardiovascular: Normal rate, regular rhythm and normal heart sounds.  Exam reveals no gallop and no friction rub.   No murmur  heard. Pulmonary/Chest: Effort normal and breath sounds normal. No respiratory distress. She has no wheezes. She exhibits no tenderness.  Abdominal: Soft. Bowel sounds are normal. She exhibits no distension and no mass. There is no tenderness. There is no rebound and no guarding.  Musculoskeletal: Normal range of motion. She exhibits no edema or tenderness.  Lymphadenopathy:    She has no cervical adenopathy.  Neurological: She is alert and oriented to person, place, and time. No cranial nerve deficit. She exhibits normal muscle tone.  Skin: Skin is warm and dry. No rash noted. No erythema. No pallor.  Nursing note and vitals reviewed.    ED Treatments / Results  DIAGNOSTIC STUDIES: Oxygen Saturation is 100% on RA, normal by my interpretation.    COORDINATION OF CARE: 11:43 PM Discussed treatment plan with pt at bedside and pt agreed to plan.  Labs (all labs ordered are listed, but only abnormal results are displayed) Labs Reviewed  RAPID STREP SCREEN (NOT AT [facility]) - Abnormal; Notable for the following:       Result Value   Streptococcus, Group A Screen (Direct) POSITIVE (*)    All other components within normal limits  URINALYSIS, ROUTINE W REFLEX MICROSCOPIC (NOT AT [facility]) - Abnormal; Notable for the following:    Hgb urine dipstick SMALL (*)    Ketones, ur 40 (*)    All other components within normal limits  URINE MICROSCOPIC-ADD ON - Abnormal; Notable for the following:    Squamous Epithelial / LPF 0-5 (*)    Bacteria, UA RARE (*)    All other components within normal limits  PREGNANCY, URINE    EKG  EKG Interpretation None       Radiology No results found.  Procedures Procedures (including critical care time)  Medications Ordered in ED Medications - No data to display   Initial Impression / Assessment and Plan / ED Course  I have reviewed the triage vital signs and the nursing notes.  Pertinent labs & imaging results that were available during my care of  the patient were reviewed by me and considered in my medical decision making (see chart for details).  Clinical Course    Patient presents to the ED for sore throat and abdominal pain.  Strep throat test was ordered by triage and is positive. We'll treat with penicillin. Patient also given Decadron for pain control. Abdominal exam is unremarkable. This may be related to her acute pharyngitis. Primary care follow-up advised in 3 days. She appears well and in no acute distress, vital signs were within her normal limits and she is safe for discharge.  Final Clinical Impressions(s) / ED Diagnoses   Final diagnoses:  None    New Prescriptions New Prescriptions   No medications on file  I personally performed the services described in this documentation, which was  scribed in my presence. The recorded information has been reviewed and is accurate.       Tomasita Crumble, MD 12/09/15 2354

## 2015-12-09 NOTE — ED Triage Notes (Signed)
Patient reports mid abdominal pain which began yesterday.  Patient also reports sore throat which began x3 days.  Denies N/V/D.

## 2015-12-10 ENCOUNTER — Encounter (HOSPITAL_BASED_OUTPATIENT_CLINIC_OR_DEPARTMENT_OTHER): Payer: Self-pay | Admitting: Emergency Medicine

## 2015-12-10 ENCOUNTER — Emergency Department (HOSPITAL_BASED_OUTPATIENT_CLINIC_OR_DEPARTMENT_OTHER)
Admission: EM | Admit: 2015-12-10 | Discharge: 2015-12-10 | Disposition: A | Discharge status: Home / Self Care | Payer: BLUE CROSS/BLUE SHIELD | Source: Home / Self Care | Attending: Emergency Medicine | Admitting: Emergency Medicine

## 2015-12-10 DIAGNOSIS — J029 Acute pharyngitis, unspecified: Secondary | ICD-10-CM | POA: Insufficient documentation

## 2015-12-10 DIAGNOSIS — F17290 Nicotine dependence, other tobacco product, uncomplicated: Secondary | ICD-10-CM | POA: Insufficient documentation

## 2015-12-10 DIAGNOSIS — R1013 Epigastric pain: Secondary | ICD-10-CM

## 2015-12-10 LAB — CBC WITH DIFFERENTIAL/PLATELET
BASOS PCT: 0 %
Basophils Absolute: 0 10*3/uL (ref 0.0–0.1)
EOS ABS: 0 10*3/uL (ref 0.0–0.7)
EOS PCT: 0 %
HCT: 37.2 % (ref 36.0–46.0)
Hemoglobin: 12.3 g/dL (ref 12.0–15.0)
Lymphocytes Relative: 16 %
Lymphs Abs: 1.5 10*3/uL (ref 0.7–4.0)
MCH: 27.1 pg (ref 26.0–34.0)
MCHC: 33.1 g/dL (ref 30.0–36.0)
MCV: 81.9 fL (ref 78.0–100.0)
MONO ABS: 0.9 10*3/uL (ref 0.1–1.0)
MONOS PCT: 9 %
NEUTROS PCT: 75 %
Neutro Abs: 7.2 10*3/uL (ref 1.7–7.7)
Platelets: 280 10*3/uL (ref 150–400)
RBC: 4.54 MIL/uL (ref 3.87–5.11)
RDW: 14.3 % (ref 11.5–15.5)
WBC: 9.6 10*3/uL (ref 4.0–10.5)

## 2015-12-10 LAB — COMPREHENSIVE METABOLIC PANEL
ALBUMIN: 4 g/dL (ref 3.5–5.0)
ALT: 11 U/L — ABNORMAL LOW (ref 14–54)
ANION GAP: 7 (ref 5–15)
AST: 12 U/L — ABNORMAL LOW (ref 15–41)
Alkaline Phosphatase: 39 U/L (ref 38–126)
BUN: 11 mg/dL (ref 6–20)
CO2: 25 mmol/L (ref 22–32)
Calcium: 9.7 mg/dL (ref 8.9–10.3)
Chloride: 105 mmol/L (ref 101–111)
Creatinine, Ser: 0.6 mg/dL (ref 0.44–1.00)
GFR calc non Af Amer: >60 mL/min (ref >60)
GLUCOSE: 92 mg/dL (ref 65–99)
POTASSIUM: 4.1 mmol/L (ref 3.5–5.1)
SODIUM: 137 mmol/L (ref 135–145)
TOTAL PROTEIN: 7.5 g/dL (ref 6.5–8.1)
Total Bilirubin: 0.3 mg/dL (ref 0.3–1.2)

## 2015-12-10 LAB — URINALYSIS, ROUTINE W REFLEX MICROSCOPIC
Bilirubin Urine: NEGATIVE
Hgb urine dipstick: NEGATIVE
Ketones, ur: 15 mg/dL — AB
LEUKOCYTES UA: NEGATIVE
Nitrite: NEGATIVE
PROTEIN: 30 mg/dL — AB
Specific Gravity, Urine: 1.041 — ABNORMAL HIGH (ref 1.005–1.030)
pH: 6 (ref 5.0–8.0)

## 2015-12-10 LAB — PREGNANCY, URINE: Preg Test, Ur: NEGATIVE

## 2015-12-10 LAB — URINE MICROSCOPIC-ADD ON

## 2015-12-10 LAB — LIPASE, BLOOD: Lipase: 19 U/L (ref 11–51)

## 2015-12-10 MED ORDER — FAMOTIDINE 40 MG PO TABS: 40.0000 mg | ORAL_TABLET | Freq: Every day | ORAL | 0 refills | Status: DC

## 2015-12-10 MED ORDER — GI COCKTAIL ~~LOC~~
30.0000 mL | Freq: Once | ORAL | Status: AC
  Administered 2015-12-10: 30 mL via ORAL
  Filled 2015-12-10: qty 30

## 2015-12-10 MED ORDER — IBUPROFEN 400 MG PO TABS
600.0000 mg | ORAL_TABLET | Freq: Once | ORAL | Status: AC
  Administered 2015-12-10: 600 mg via ORAL
  Filled 2015-12-10: qty 1

## 2015-12-10 NOTE — ED Notes (Signed)
MD at bedside. 

## 2015-12-10 NOTE — ED Notes (Signed)
Pt verbalizes understanding of d/c instructions and denies any further needs at this time. 

## 2015-12-10 NOTE — Discharge Instructions (Signed)
Avoid taking ibuprofen as it may be what is upsetting your stomach. Take tylenol for pain and pepcid as prescribed.  Return for worsening symptoms, including fever, escalating pain, intractable vomiting or any other symptoms concerning to you.

## 2015-12-10 NOTE — ED Provider Notes (Signed)
[facility] Provider Note   CSN: [id number] Arrival date & time: 12/10/15  1744   By signing my name below, I, Christel Mormon, attest that this documentation has been prepared under the direction and in the presence of Lavera Guise, MD . Electronically Signed: Christel Mormon, Scribe. 12/10/2015. 8:13 PM.   History   Chief Complaint Chief Complaint  Patient presents with  . Abdominal Pain    The history is provided by the patient. No language interpreter was used.   HPI Comments:  Yvette Perez is a 25 y.o. female who presents to the Emergency Department complaining of constant, non-radiating abdominal pain x3 days. Pt was diagnosed with strep yesterday and received a antibiotic IM. Pt complains of associated sore throat, which as improved but abdominal has not. Pt states that she intermittently feels as if she is going to have a BM but this sensation then resolves. Pt has taken ibuprofen with no relief. She denies any nausea, vomiting, diarrhea, dysuria, urinary frequency, abnormal vaginal bleeding or discharge, fevers or chills. Pain not associated with eating. Pain localized to the epigastrium and nonradiating. It is sharp and burning in nature.  Past Medical History:  Diagnosis Date  . Medical history non-contributory     Patient Active Problem List   Diagnosis Date Noted  . SVD (spontaneous vaginal delivery) 09/11/2012  . Postpartum care following vaginal delivery (6/21) 09/11/2012    Past Surgical History:  Procedure Laterality Date  . NO PAST SURGERIES      OB History    Gravida Para Term Preterm AB Living   1 1 1     1    SAB TAB Ectopic Multiple Live Births           1       Home Medications    Prior to Admission medications   Medication Sig Start Date End Date Taking? Authorizing Provider  famotidine (PEPCID) 40 MG tablet Take 1 tablet (40 mg total) by mouth daily. 12/10/15   Lavera Guise, MD  ibuprofen (ADVIL,MOTRIN) 600 MG tablet Take 1  tablet (600 mg total) by mouth every 6 (six) hours. 09/12/12   Marlinda Mike, CNM  ibuprofen (ADVIL,MOTRIN) 800 MG tablet Take 1 tablet (800 mg total) by mouth 3 (three) times daily. 04/01/15   Courteney Lyn Mackuen, MD  metroNIDAZOLE (FLAGYL) 500 MG tablet Take 1 tablet (500 mg total) by mouth 2 (two) times daily. 03/16/15   Melton Krebs, PA-C  naproxen (NAPROSYN) 500 MG tablet Take 1 tablet (500 mg total) by mouth 2 (two) times daily. 11/15/13   Vanetta Mulders, MD  predniSONE (DELTASONE) 50 MG tablet Take 1 tablet daily with breakfast 11/02/15   Wynetta Emery, PA-C  Prenatal Vit-Fe Fumarate-FA (PRENATAL MULTIVITAMIN) TABS Take 1 tablet by mouth daily at 12 noon.    Historical Provider, MD    Family History History reviewed. No pertinent family history.  Social History Social History  Substance Use Topics  . Smoking status: Current Every Day Smoker    Packs/day: 1.00    Types: Cigars  . Smokeless tobacco: Never Used  . Alcohol use No     Allergies   Review of patient's allergies indicates no known allergies.   Review of Systems Review of Systems  HENT: Positive for sore throat.   Gastrointestinal: Positive for abdominal pain. Negative for diarrhea and vomiting.  Genitourinary: Negative for dysuria and frequency.  All other systems reviewed and are negative.    Physical Exam Updated Vital Signs  BP 119/85 (BP Location: Right Arm)   Pulse 77   Temp 98.1 F (36.7 C) (Oral)   Resp 18   Ht 5\' 2"  (1.575 m)   Wt 130 lb (59 kg)   SpO2 100%   BMI 23.78 kg/m   Physical Exam Physical Exam  Nursing note and vitals reviewed. Constitutional: Well developed, well nourished, non-toxic, and in no acute distress Head: Normocephalic and atraumatic.  Mouth/Throat: Oropharynx is clear and moist.  Neck: Normal range of motion. Neck supple.  Cardiovascular: Normal rate and regular rhythm.   Pulmonary/Chest: Effort normal and breath sounds normal.  Abdominal: Soft,  non-distended. Epigastric pain with palpation. There is no rebound and no guarding.  Musculoskeletal: Normal range of motion.  Neurological: Alert, no facial droop, fluent speech, moves all extremities symmetrically Skin: Skin is warm and dry.  Psychiatric: Cooperative;  ED Treatments / Results  DIAGNOSTIC STUDIES:  Oxygen Saturation is 100% on RA, normal by my interpretation.    COORDINATION OF CARE:  8:13 PM Discussed treatment plan with pt at bedside and pt agreed to plan.   Labs (all labs ordered are listed, but only abnormal results are displayed) Labs Reviewed  URINALYSIS, ROUTINE W REFLEX MICROSCOPIC (NOT AT [facility]) - Abnormal; Notable for the following:       Result Value   Specific Gravity, Urine 1.041 (*)    Glucose, UA >1000 (*)    Ketones, ur 15 (*)    Protein, ur 30 (*)    All other components within normal limits  URINE MICROSCOPIC-ADD ON - Abnormal; Notable for the following:    Squamous Epithelial / LPF 0-5 (*)    Bacteria, UA FEW (*)    All other components within normal limits  COMPREHENSIVE METABOLIC PANEL - Abnormal; Notable for the following:    AST 12 (*)    ALT 11 (*)    All other components within normal limits  PREGNANCY, URINE  CBC WITH DIFFERENTIAL/PLATELET  LIPASE, BLOOD    EKG  EKG Interpretation None       Radiology No results found.  Procedures Procedures (including critical care time)  Medications Ordered in ED Medications  gi cocktail (Maalox,Lidocaine,Donnatal) (30 mLs Oral Given 12/10/15 2024)     Initial Impression / Assessment and Plan / ED Course  I have reviewed the triage vital signs and the nursing notes.  Pertinent labs & imaging results that were available during my care of the patient were reviewed by me and considered in my medical decision making (see chart for details).  Clinical Course   25 year old female who presents with persistent epigastric abdominal pain for the past few days. She is nontoxic in no  acute distress. Vital signs are within normal limits. She has a nonsurgical abdomen with focal epigastric tenderness. Differential includes gastritis versus peptic ulcer disease versus pancreatitis. No right upper quadrant tenderness this is a suspect hepatobiliary disease such as cholecystitis or cholelithiasis. I  n the setting of her ibuprofen usage, suspect potential gastritis and given GI cocktail with some improvement in symptoms. Basic blood work including CBC, comprehensive metabolic profile, lipase, urine pregnancy and urinalysis overall unremarkable. The patient to discontinue anti-inflammatory medications and take Tylenol for pain control. Given Pepcid prescription. I do not suspect serious acute intra-abdominal process at this time. The patient appears reasonably screened and/or stabilized for discharge and I doubt any other medical condition or other [facility] requiring further screening, evaluation, or treatment in the ED at this time prior to discharge. Strict return and  follow-up instructions reviewed. She expressed understanding of all discharge instructions and felt comfortable with the plan of care.    Final Clinical Impressions(s) / ED Diagnoses   Final diagnoses:  Epigastric abdominal pain    New Prescriptions Discharge Medication List as of 12/10/2015  9:08 PM    START taking these medications   Details  famotidine (PEPCID) 40 MG tablet Take 1 tablet (40 mg total) by mouth daily., Starting Mon 12/10/2015, Print       I personally performed the services described in this documentation, which was scribed in my presence. The recorded information has been reviewed and is accurate.     Lavera Guiseana Duo Dawnetta Copenhaver, MD 12/11/15 [phone]

## 2015-12-10 NOTE — ED Notes (Signed)
Pt alert, NAD, calm, interactive, resps e/u, no dyspnea noted, seen by EDP prior to RN assessment, see MD notes, orders received to medicate and d/c, care assumed at time of d/c.

## 2015-12-10 NOTE — ED Triage Notes (Signed)
Patient states that she was here yesterday and dx with Strep throat. Reports that she was told this is where her stomach pain is coming from. Reports that the her stomach pain is still there and she needs to be rechecked

## 2016-04-02 ENCOUNTER — Emergency Department (HOSPITAL_BASED_OUTPATIENT_CLINIC_OR_DEPARTMENT_OTHER)
Admission: EM | Admit: 2016-04-02 | Discharge: 2016-04-02 | Disposition: A | Discharge status: Home / Self Care | Payer: BLUE CROSS/BLUE SHIELD | Attending: Emergency Medicine | Admitting: Emergency Medicine

## 2016-04-02 ENCOUNTER — Encounter (HOSPITAL_BASED_OUTPATIENT_CLINIC_OR_DEPARTMENT_OTHER): Payer: Self-pay

## 2016-04-02 DIAGNOSIS — F17210 Nicotine dependence, cigarettes, uncomplicated: Secondary | ICD-10-CM | POA: Insufficient documentation

## 2016-04-02 DIAGNOSIS — J028 Acute pharyngitis due to other specified organisms: Secondary | ICD-10-CM | POA: Insufficient documentation

## 2016-04-02 DIAGNOSIS — J020 Streptococcal pharyngitis: Secondary | ICD-10-CM

## 2016-04-02 DIAGNOSIS — B958 Unspecified staphylococcus as the cause of diseases classified elsewhere: Secondary | ICD-10-CM | POA: Insufficient documentation

## 2016-04-02 DIAGNOSIS — F17290 Nicotine dependence, other tobacco product, uncomplicated: Secondary | ICD-10-CM | POA: Insufficient documentation

## 2016-04-02 LAB — RAPID STREP SCREEN (MED CTR MEBANE ONLY): STREPTOCOCCUS, GROUP A SCREEN (DIRECT): POSITIVE — AB

## 2016-04-02 MED ORDER — CLINDAMYCIN HCL 300 MG PO CAPS: 300.0000 mg | ORAL_CAPSULE | Freq: Four times a day (QID) | ORAL | 0 refills | Status: DC

## 2016-04-02 MED ORDER — DEXAMETHASONE 6 MG PO TABS
10.0000 mg | ORAL_TABLET | Freq: Once | ORAL | Status: AC
  Administered 2016-04-02: 10 mg via ORAL
  Filled 2016-04-02: qty 1

## 2016-04-02 MED ORDER — PENICILLIN G BENZATHINE 1200000 UNIT/2ML IM SUSP
1.2000 10*6.[IU] | Freq: Once | INTRAMUSCULAR | Status: AC
  Administered 2016-04-02: 1.2 10*6.[IU] via INTRAMUSCULAR
  Filled 2016-04-02: qty 2

## 2016-04-02 MED ORDER — CLINDAMYCIN HCL 150 MG PO CAPS: 300.0000 mg | ORAL_CAPSULE | Freq: Three times a day (TID) | ORAL | Status: DC

## 2016-04-02 NOTE — ED Triage Notes (Signed)
Sore throat x 3 days-NAD-steady gait 

## 2016-04-02 NOTE — ED Provider Notes (Signed)
[facility]   CSN: [id number] Arrival date & time: 04/02/16  1638     History   Chief Complaint Chief Complaint  Patient presents with  . Sore Throat    HPI Yvette Perez is a 26 y.o. female.   Sore Throat  This is a recurrent problem. The current episode started 2 days ago. The problem occurs constantly. The problem has not changed since onset.Pertinent negatives include no chest pain and no shortness of breath. Nothing aggravates the symptoms. Nothing relieves the symptoms. She has tried nothing for the symptoms.    Past Medical History:  Diagnosis Date  . Medical history non-contributory     Patient Active Problem List   Diagnosis Date Noted  . SVD (spontaneous vaginal delivery) 09/11/2012  . Postpartum care following vaginal delivery (6/21) 09/11/2012    Past Surgical History:  Procedure Laterality Date  . NO PAST SURGERIES      OB History    Gravida Para Term Preterm AB Living   1 1 1     1    SAB TAB Ectopic Multiple Live Births           1       Home Medications    Prior to Admission medications   Medication Sig Start Date End Date Taking? Authorizing Provider  clindamycin (CLEOCIN) 300 MG capsule Take 1 capsule (300 mg total) by mouth 4 (four) times daily. X 7 days, if symptoms not improving. 04/05/16   Marily Memos, MD    Family History No family history on file.  Social History Social History  Substance Use Topics  . Smoking status: Current Every Day Smoker    Packs/day: 1.00    Types: Cigars, Cigarettes  . Smokeless tobacco: Never Used  . Alcohol use No     Allergies   Patient has no known allergies.   Review of Systems Review of Systems  Constitutional: Negative for chills.  HENT: Positive for congestion and sore throat.   Respiratory: Negative for cough and shortness of breath.   Cardiovascular: Negative for chest pain.  All other systems reviewed and are negative.    Physical Exam Updated Vital  Signs BP 132/75 (BP Location: Left Arm)   Pulse 105   Temp 99.9 F (37.7 C) (Oral)   Resp 18   Ht 5\' 2"  (1.575 m)   Wt 128 lb (58.1 kg)   SpO2 97%   BMI 23.41 kg/m   Physical Exam  Constitutional: She is oriented to person, place, and time. She appears well-developed and well-nourished. No distress.  HENT:  Head: Normocephalic and atraumatic.  Mouth/Throat: Mucous membranes are not dry. Oropharyngeal exudate, posterior oropharyngeal edema and posterior oropharyngeal erythema present. No tonsillar abscesses.  Eyes: Conjunctivae and EOM are normal.  Neck: Normal range of motion. Neck supple. No JVD present. Thyromegaly present.  Cardiovascular: Normal rate and regular rhythm.   No murmur heard. Pulmonary/Chest: Effort normal and breath sounds normal. No stridor. No respiratory distress.  Abdominal: Soft. There is no tenderness.  Musculoskeletal: Normal range of motion. She exhibits no edema or deformity.  Lymphadenopathy:    She has cervical adenopathy.  Neurological: She is alert and oriented to person, place, and time.  Skin: Skin is warm and dry.  Psychiatric: She has a normal mood and affect.  Nursing note and vitals reviewed.    ED Treatments / Results  Labs (all labs ordered are listed, but only abnormal results are displayed) Labs Reviewed  RAPID STREP SCREEN (  NOT AT [facility]) - Abnormal; Notable for the following:       Result Value   Streptococcus, Group A Screen (Direct) POSITIVE (*)    All other components within normal limits    EKG  EKG Interpretation None       Radiology No results found.  Procedures Procedures (including critical care time)  Medications Ordered in ED Medications  dexamethasone (DECADRON) tablet 10 mg (not administered)  penicillin g benzathine (BICILLIN LA) 1200000 UNIT/2ML injection 1.2 Million Units (not administered)     Initial Impression / Assessment and Plan / ED Course  I have reviewed the triage vital signs and the  nursing notes.  Pertinent labs & imaging results that were available during my care of the patient were reviewed by me and considered in my medical decision making (see chart for details).  Clinical Course     Strep without evidence of complication. Gave decadron and PCN here.  Recent h/o strep so gave rx for clindamycin in case it is not getting better and needs a different abx.   Final Clinical Impressions(s) / ED Diagnoses   Final diagnoses:  Pharyngitis due to Streptococcus species    New Prescriptions New Prescriptions   CLINDAMYCIN (CLEOCIN) 300 MG CAPSULE    Take 1 capsule (300 mg total) by mouth 4 (four) times daily. X 7 days, if symptoms not improving.     Marily MemosJason Belisa Eichholz, MD 04/02/16 [phone]

## 2016-04-08 ENCOUNTER — Encounter (HOSPITAL_BASED_OUTPATIENT_CLINIC_OR_DEPARTMENT_OTHER): Payer: Self-pay | Admitting: *Deleted

## 2016-04-08 ENCOUNTER — Emergency Department (HOSPITAL_BASED_OUTPATIENT_CLINIC_OR_DEPARTMENT_OTHER)
Admission: EM | Admit: 2016-04-08 | Discharge: 2016-04-08 | Disposition: A | Discharge status: Home / Self Care | Payer: BLUE CROSS/BLUE SHIELD | Attending: Emergency Medicine | Admitting: Emergency Medicine

## 2016-04-08 DIAGNOSIS — N766 Ulceration of vulva: Secondary | ICD-10-CM | POA: Insufficient documentation

## 2016-04-08 DIAGNOSIS — A6004 Herpesviral vulvovaginitis: Secondary | ICD-10-CM

## 2016-04-08 DIAGNOSIS — F17210 Nicotine dependence, cigarettes, uncomplicated: Secondary | ICD-10-CM | POA: Insufficient documentation

## 2016-04-08 DIAGNOSIS — B9689 Other specified bacterial agents as the cause of diseases classified elsewhere: Secondary | ICD-10-CM

## 2016-04-08 DIAGNOSIS — N760 Acute vaginitis: Secondary | ICD-10-CM | POA: Insufficient documentation

## 2016-04-08 LAB — PREGNANCY, URINE: PREG TEST UR: NEGATIVE

## 2016-04-08 LAB — URINALYSIS, ROUTINE W REFLEX MICROSCOPIC
BILIRUBIN URINE: NEGATIVE
Glucose, UA: NEGATIVE mg/dL
HGB URINE DIPSTICK: NEGATIVE
KETONES UR: NEGATIVE mg/dL
NITRITE: NEGATIVE
PROTEIN: NEGATIVE mg/dL
Specific Gravity, Urine: 1.021 (ref 1.005–1.030)
pH: 7 (ref 5.0–8.0)

## 2016-04-08 LAB — URINALYSIS, MICROSCOPIC (REFLEX)

## 2016-04-08 LAB — WET PREP, GENITAL
Sperm: NONE SEEN
Trich, Wet Prep: NONE SEEN
YEAST WET PREP: NONE SEEN

## 2016-04-08 MED ORDER — ACYCLOVIR 400 MG PO TABS: 400.0000 mg | ORAL_TABLET | Freq: Three times a day (TID) | ORAL | 0 refills | Status: DC

## 2016-04-08 MED ORDER — METRONIDAZOLE 500 MG PO TABS: 500.0000 mg | ORAL_TABLET | Freq: Two times a day (BID) | ORAL | 0 refills | Status: DC

## 2016-04-08 NOTE — ED Triage Notes (Addendum)
She has a lesion on her vagina that is painful and irritating.

## 2016-04-08 NOTE — ED Provider Notes (Signed)
[facility] Provider Note   CSN: [id number] Arrival date & time: 04/08/16 1145     History    Chief Complaint  Patient presents with  . Skin Ulcer     HPI Yvette Perez is a 26 y.o. female.  25yo F who p/w vaginal ulcer. Patient reports a few days of painful ulceration on her vagina that hurts worse when she sits down. She denies any vaginal discharge or bleeding. No urinary symptoms, abdominal pain, or fevers. She has never had this before. She is sexually active with one partner and sometimes uses condoms.   Past Medical History:  Diagnosis Date  . Medical history non-contributory      Patient Active Problem List   Diagnosis Date Noted  . SVD (spontaneous vaginal delivery) 09/11/2012  . Postpartum care following vaginal delivery (6/21) 09/11/2012    Past Surgical History:  Procedure Laterality Date  . NO PAST SURGERIES      OB History    Gravida Para Term Preterm AB Living   1 1 1     1    SAB TAB Ectopic Multiple Live Births           1        Home Medications    Prior to Admission medications   Medication Sig Start Date End Date Taking? Authorizing Provider  acyclovir (ZOVIRAX) 400 MG tablet Take 1 tablet (400 mg total) by mouth 3 (three) times daily. 04/08/16   Laurence Spates, MD  clindamycin (CLEOCIN) 300 MG capsule Take 1 capsule (300 mg total) by mouth 4 (four) times daily. X 7 days, if symptoms not improving. 04/05/16   Marily Memos, MD  metroNIDAZOLE (FLAGYL) 500 MG tablet Take 1 tablet (500 mg total) by mouth 2 (two) times daily. 04/08/16   Laurence Spates, MD      No family history on file.   Social History  Substance Use Topics  . Smoking status: Current Every Day Smoker    Packs/day: 1.00    Types: Cigars, Cigarettes  . Smokeless tobacco: Never Used  . Alcohol use No     Allergies     Patient has no known allergies.    Review of Systems  10 Systems reviewed and are negative for acute change except as noted  in the HPI.   Physical Exam Updated Vital Signs BP 135/92 (BP Location: Right Arm)   Pulse 99   Temp 98.2 F (36.8 C) (Oral)   Resp 16   Ht 5\' 2"  (1.575 m)   Wt 128 lb (58.1 kg)   SpO2 100%   BMI 23.41 kg/m   Physical Exam  Constitutional: She is oriented to person, place, and time. She appears well-developed and well-nourished. No distress.  HENT:  Head: Normocephalic and atraumatic.  Moist mucous membranes  Eyes: Conjunctivae are normal. Pupils are equal, round, and reactive to light.  Neck: Neck supple.  Cardiovascular: Normal rate, regular rhythm and normal heart sounds.   No murmur heard. Pulmonary/Chest: Effort normal and breath sounds normal.  Abdominal: Soft. Bowel sounds are normal. She exhibits no distension. There is no tenderness.  Genitourinary:    Vaginal discharge found.  Genitourinary Comments: Ulceration on R labia minora, tender to palpation with no drainage; moderate white discharge in vaginal vault, no cervical motion/adnexal tenderness  Musculoskeletal: She exhibits no edema.  Neurological: She is alert and oriented to person, place, and time.  Fluent speech  Skin: Skin is warm and dry.  See GU exam  Psychiatric:  She has a normal mood and affect. Judgment normal.  Nursing note and vitals reviewed.  Chaperone was present during exam.    ED Treatments / Results  Labs (all labs ordered are listed, but only abnormal results are displayed) Labs Reviewed  WET PREP, GENITAL - Abnormal; Notable for the following:       Result Value   Clue Cells Wet Prep HPF POC PRESENT (*)    WBC, Wet Prep HPF POC MANY (*)    All other components within normal limits  URINALYSIS, ROUTINE W REFLEX MICROSCOPIC - Abnormal; Notable for the following:    APPearance CLOUDY (*)    Leukocytes, UA MODERATE (*)    All other components within normal limits  URINALYSIS, MICROSCOPIC (REFLEX) - Abnormal; Notable for the following:    Bacteria, UA MANY (*)    Squamous  Epithelial / LPF TOO NUMEROUS TO COUNT (*)    All other components within normal limits  PREGNANCY, URINE  GC/CHLAMYDIA PROBE AMP ([place]) NOT AT [facility]     EKG  EKG Interpretation  Date/Time:    Ventricular Rate:    PR Interval:    QRS Duration:   QT Interval:    QTC Calculation:   R Axis:     Text Interpretation:           Radiology No results found.  Procedures Procedures (including critical care time) Procedures  Medications Ordered in ED  Medications - No data to display   Initial Impression / Assessment and Plan / ED Course  I have reviewed the triage vital signs and the nursing notes.  Pertinent labs that were available during my care of the patient were reviewed by me and considered in my medical decision making (see chart for details).  Clinical Course     Pt w/ painful genital ulcer x a few days, no other complaints. Moderate amount of vaginal discharge, solitary ulcer on R labia minora. Concern for genital herpes given that lesion is painful and ulcerated. Wet prep shows clue cells, given discharge will treat w/ flagyl. GC/chlamydia sent but did not empirically treat as pt denying any symptoms. Discussed treatment for HSV w/ acyclovir and recommended [facility]. Reviewed precautions including avoiding sexual contact during outbreak as she is highly infectious when lesions are present. Reviewed return precautions including abd pain, fever, vomiting. She voiced understanding and was discharged in satisfactory condition.  Final Clinical Impressions(s) / ED Diagnoses   Final diagnoses:  Herpes simplex vulvovaginitis  BV (bacterial vaginosis)     Discharge Medication List as of 04/08/2016  1:45 PM    START taking these medications   Details  acyclovir (ZOVIRAX) 400 MG tablet Take 1 tablet (400 mg total) by mouth 3 (three) times daily., Starting Tue 04/08/2016, Print    metroNIDAZOLE (FLAGYL) 500 MG tablet Take 1  tablet (500 mg total) by mouth 2 (two) times daily., Starting Tue 04/08/2016, Print           Laurence Spatesachel Morgan Little, MD 04/08/16 [phone]

## 2016-04-10 LAB — GC/CHLAMYDIA PROBE AMP (~~LOC~~) NOT AT ARMC
CHLAMYDIA, DNA PROBE: NEGATIVE
Neisseria Gonorrhea: NEGATIVE

## 2016-09-08 ENCOUNTER — Emergency Department (HOSPITAL_BASED_OUTPATIENT_CLINIC_OR_DEPARTMENT_OTHER)
Admission: EM | Admit: 2016-09-08 | Discharge: 2016-09-08 | Disposition: A | Discharge status: Home / Self Care | Payer: BLUE CROSS/BLUE SHIELD | Attending: Emergency Medicine | Admitting: Emergency Medicine

## 2016-09-08 ENCOUNTER — Encounter (HOSPITAL_BASED_OUTPATIENT_CLINIC_OR_DEPARTMENT_OTHER): Payer: Self-pay | Admitting: *Deleted

## 2016-09-08 DIAGNOSIS — J029 Acute pharyngitis, unspecified: Secondary | ICD-10-CM | POA: Insufficient documentation

## 2016-09-08 DIAGNOSIS — F17290 Nicotine dependence, other tobacco product, uncomplicated: Secondary | ICD-10-CM | POA: Insufficient documentation

## 2016-09-08 LAB — RAPID STREP SCREEN (MED CTR MEBANE ONLY): STREPTOCOCCUS, GROUP A SCREEN (DIRECT): NEGATIVE

## 2016-09-08 MED ORDER — HYDROCODONE-ACETAMINOPHEN 7.5-325 MG/15ML PO SOLN: 15.0000 mL | Freq: Four times a day (QID) | ORAL | 0 refills | Status: DC | PRN

## 2016-09-08 MED ORDER — IBUPROFEN 400 MG PO TABS
600.0000 mg | ORAL_TABLET | Freq: Once | ORAL | Status: AC
  Administered 2016-09-08: 600 mg via ORAL
  Filled 2016-09-08: qty 1

## 2016-09-08 MED ORDER — HYDROCODONE-ACETAMINOPHEN 7.5-325 MG/15ML PO SOLN
10.0000 mL | Freq: Once | ORAL | Status: AC
  Administered 2016-09-08: 10 mL via ORAL
  Filled 2016-09-08: qty 15

## 2016-09-08 MED ORDER — ACETAMINOPHEN 325 MG PO TABS
650.0000 mg | ORAL_TABLET | Freq: Once | ORAL | Status: AC
  Administered 2016-09-08: 650 mg via ORAL
  Filled 2016-09-08: qty 2

## 2016-09-08 NOTE — Discharge Instructions (Signed)
Your strep test was negative today.  We sent a culture to confirm. You will be notified within 2-3 days if culture returned positive, at that time you will need antibiotics.   We will treat your symptoms conservatively at this point with ibuprofen, salt water gargles and Hycet.   You are at risk for developing a deeper neck abscess or infection. Return to the ED if you develop worsening symptoms, drooling, neck stiffness, changes in voice, anterior neck swelling, difficulty breathing.

## 2016-09-08 NOTE — ED Notes (Signed)
ED Provider at bedside. 

## 2016-09-08 NOTE — ED Provider Notes (Signed)
[facility] Provider Note   CSN: [id number] Arrival date & time: 09/08/16  1557  By signing my name below, I, Doreatha Martin, attest that this documentation has been prepared under the direction and in the presence of  Sharen Heck, PA-C. Electronically Signed: Doreatha Martin, ED Scribe. 09/08/16. 5:00 PM.    History   Chief Complaint Chief Complaint  Patient presents with  . Sore Throat    HPI Yvette Perez is a 26 y.o. female who presents to the Emergency Department complaining of moderate, constant sore throat that began yesterday with associated body aches. Pt states her pain is worsened with swallowing. Pt denies taking OTC medications at home to improve symptoms. No recent tick exposure or sick contact with similar symptoms. She states her symptoms are similar to prior strep throat infection. She denies fever, nasal congestion, rhinorrhea, nausea, vomiting, difficulty urinating, rashes, cough, difficulty swallowing or tolerating secretions.  No voice changes, drooling, neck stiffness.   The history is provided by the patient. No language interpreter was used.    Past Medical History:  Diagnosis Date  . Medical history non-contributory     Patient Active Problem List   Diagnosis Date Noted  . SVD (spontaneous vaginal delivery) 09/11/2012  . Postpartum care following vaginal delivery (6/21) 09/11/2012    Past Surgical History:  Procedure Laterality Date  . NO PAST SURGERIES      OB History    Gravida Para Term Preterm AB Living   1 1 1     1    SAB TAB Ectopic Multiple Live Births           1       Home Medications    Prior to Admission medications   Medication Sig Start Date End Date Taking? Authorizing Provider  acyclovir (ZOVIRAX) 400 MG tablet Take 1 tablet (400 mg total) by mouth 3 (three) times daily. 04/08/16   Little, Ambrose Finland, MD  clindamycin (CLEOCIN) 300 MG capsule Take 1 capsule (300 mg total) by mouth 4 (four) times daily. X 7 days, if  symptoms not improving. 04/05/16   Mesner, Barbara Cower, MD  HYDROcodone-acetaminophen (HYCET) 7.5-325 mg/15 ml solution Take 15 mLs by mouth 4 (four) times daily as needed for moderate pain. 09/08/16 09/08/17  Liberty Handy, PA-C  metroNIDAZOLE (FLAGYL) 500 MG tablet Take 1 tablet (500 mg total) by mouth 2 (two) times daily. 04/08/16   Little, Ambrose Finland, MD    Family History No family history on file.  Social History Social History  Substance Use Topics  . Smoking status: Current Every Day Smoker    Packs/day: 1.00    Types: Cigars, Cigarettes  . Smokeless tobacco: Never Used  . Alcohol use No     Allergies   Patient has no known allergies.   Review of Systems Review of Systems  Constitutional: Negative for fever.  HENT: Positive for sore throat. Negative for congestion, rhinorrhea and trouble swallowing.   Respiratory: Negative for cough and shortness of breath.   Cardiovascular: Negative for chest pain.  Gastrointestinal: Negative for abdominal pain, constipation, diarrhea, nausea and vomiting.  Genitourinary: Negative for difficulty urinating.  Musculoskeletal: Positive for myalgias.  Skin: Negative for rash.     Physical Exam Updated Vital Signs BP (!) 126/93   Pulse 96   Temp 98.7 F (37.1 C) (Oral)   Resp 16   Ht 5\' 2"  (1.575 m)   Wt 59 kg (130 lb)   SpO2 98%   BMI 23.78 kg/m  Physical Exam  Constitutional: She is oriented to person, place, and time. She appears well-developed and well-nourished. No distress.  NAD.  HENT:  Head: Normocephalic and atraumatic.  Right Ear: External ear normal.  Left Ear: External ear normal.  Nose: Nose normal.  Mouth/Throat: Posterior oropharyngeal edema and posterior oropharyngeal erythema present. No oropharyngeal exudate. Tonsils are 2+ on the right. Tonsils are 2+ on the left. Tonsillar exudate.  Posterior oropharynx with mild erythema and edema without petechiae or exudates.  Tonsils symmetric with mild erythema,  edema and exudates No trismus. Phonation normal, no hot potato voice.  No facial or anterior neck edema, erythema or erythema. No sublingual edema or tenderness.  Soft palate flat without tenderness.  No pooling of oral secretions.  Maxilla and mandible nontender. Mastoids without edema, erythema or tenderness.   Eyes: Conjunctivae and EOM are normal. No scleral icterus.  Neck: Normal range of motion. Neck supple.  Tender submandibular adenopathy bilateral   Cardiovascular: Normal rate, regular rhythm and normal heart sounds.   No murmur heard. Pulmonary/Chest: Effort normal and breath sounds normal. She has no wheezes.  Musculoskeletal: Normal range of motion. She exhibits no deformity.  Lymphadenopathy:       Head (right side): Submandibular adenopathy present.       Head (left side): Submandibular adenopathy present.    She has cervical adenopathy.  Neurological: She is alert and oriented to person, place, and time.  Skin: Skin is warm and dry. Capillary refill takes less than 2 seconds.  Psychiatric: She has a normal mood and affect. Her behavior is normal. Judgment and thought content normal.  Nursing note and vitals reviewed.    ED Treatments / Results   DIAGNOSTIC STUDIES: Oxygen Saturation is 98% on RA, normal by my interpretation.    COORDINATION OF CARE: 4:57 PM Discussed treatment plan with pt at bedside which includes rapid strep and pt agreed to plan.    Labs (all labs ordered are listed, but only abnormal results are displayed) Labs Reviewed  RAPID STREP SCREEN (NOT AT [facility])  CULTURE, GROUP A STREP [facility])   Procedures Procedures (including critical care time)  Medications Ordered in ED Medications  ibuprofen (ADVIL,MOTRIN) tablet 600 mg (600 mg Oral Given 09/08/16 1715)  acetaminophen (TYLENOL) tablet 650 mg (650 mg Oral Given 09/08/16 1715)  HYDROcodone-acetaminophen (HYCET) 7.5-325 mg/15 ml solution 10 mL (10 mLs Oral Given 09/08/16 1738)     Initial  Impression / Assessment and Plan / ED Course  I have reviewed the triage vital signs and the nursing notes.  Pertinent lab results that were available during my care of the patient were reviewed by me and considered in my medical decision making (see chart for details).     Pt with negative strep.  Only mild erythema and edema to oropharynx/tonsils with exudate.  Probably viral pharyngitis. Pending culture. No abx indicated at this time. Discussed that results of strep culture are pending and patient will be informed if positive result and abx will be called in at that time. Discharge with symptomatic tx including ibuprofen, Tylenol, Hycet and saltwater rinses. No evidence of dehydration. Pt is tolerating secretions. Presentation not concerning for peritonsillar abscess or spread of infection to deep spaces of the throat; patent airway. Specific return precautions discussed. Recommended PCP follow up. Pt appears safe for discharge.   Final Clinical Impressions(s) / ED Diagnoses   Final diagnoses:  Sore throat    New Prescriptions New Prescriptions   HYDROCODONE-ACETAMINOPHEN (HYCET) 7.5-325 MG/15 ML SOLUTION  Take 15 mLs by mouth 4 (four) times daily as needed for moderate pain.    I personally performed the services described in this documentation, which was scribed in my presence. The recorded information has been reviewed and is accurate.    Liberty HandyGibbons, Allisyn Kunz J, PA-C 09/08/16 1746    Vanetta MuldersZackowski, Scott, MD 09/18/16 [phone]

## 2016-09-08 NOTE — ED Triage Notes (Signed)
Sore throat since yesterday. Body aches.

## 2016-09-11 LAB — CULTURE, GROUP A STREP (THRC)

## 2016-11-11 ENCOUNTER — Emergency Department (HOSPITAL_BASED_OUTPATIENT_CLINIC_OR_DEPARTMENT_OTHER)
Admission: EM | Admit: 2016-11-11 | Discharge: 2016-11-11 | Disposition: A | Discharge status: Home / Self Care | Payer: BLUE CROSS/BLUE SHIELD | Attending: Emergency Medicine | Admitting: Emergency Medicine

## 2016-11-11 ENCOUNTER — Encounter (HOSPITAL_BASED_OUTPATIENT_CLINIC_OR_DEPARTMENT_OTHER): Payer: Self-pay | Admitting: *Deleted

## 2016-11-11 DIAGNOSIS — F17210 Nicotine dependence, cigarettes, uncomplicated: Secondary | ICD-10-CM | POA: Insufficient documentation

## 2016-11-11 DIAGNOSIS — Z202 Contact with and (suspected) exposure to infections with a predominantly sexual mode of transmission: Secondary | ICD-10-CM | POA: Insufficient documentation

## 2016-11-11 DIAGNOSIS — Z113 Encounter for screening for infections with a predominantly sexual mode of transmission: Secondary | ICD-10-CM | POA: Insufficient documentation

## 2016-11-11 LAB — WET PREP, GENITAL
Sperm: NONE SEEN
TRICH WET PREP: NONE SEEN
Yeast Wet Prep HPF POC: NONE SEEN

## 2016-11-11 NOTE — ED Triage Notes (Signed)
Pt requests to be checked for 'all STDs'. Denies symptoms.  Pt very vague in triage.

## 2016-11-11 NOTE — ED Notes (Signed)
ED Provider at bedside. 

## 2016-11-11 NOTE — ED Provider Notes (Signed)
[facility]   CSN: [id number] Arrival date & time: 11/11/16  1837     History   Chief Complaint Chief Complaint  Patient presents with  . Exposure to STD    HPI Yvette Perez is a 26 y.o. female.   Exposure to STD  This is a new problem. Episode onset: 4 days. Episode frequency: once. The problem has not changed since onset.Pertinent negatives include no chest pain, no abdominal pain, no headaches and no shortness of breath. Nothing aggravates the symptoms. Nothing relieves the symptoms. She has tried nothing for the symptoms.    Had sexual relations with a guy this weekend. The guy's girlfriend told the patient the guy had HSV. She denies rash, vaginal bleeding, dicharge, abd pain.    Past Medical History:  Diagnosis Date  . Medical history non-contributory     Patient Active Problem List   Diagnosis Date Noted  . SVD (spontaneous vaginal delivery) 09/11/2012  . Postpartum care following vaginal delivery (6/21) 09/11/2012    Past Surgical History:  Procedure Laterality Date  . NO PAST SURGERIES      OB History    Gravida Para Term Preterm AB Living   1 1 1     1    SAB TAB Ectopic Multiple Live Births           1       Home Medications    Prior to Admission medications   Not on File    Family History History reviewed. No pertinent family history.  Social History Social History  Substance Use Topics  . Smoking status: Current Every Day Smoker    Packs/day: 1.00    Types: Cigars, Cigarettes  . Smokeless tobacco: Never Used  . Alcohol use No     Allergies   Patient has no known allergies.   Review of Systems Review of Systems  Respiratory: Negative for shortness of breath.   Cardiovascular: Negative for chest pain.  Gastrointestinal: Negative for abdominal pain.  Neurological: Negative for headaches.   All other systems are reviewed and are negative for acute change except as noted in the HPI   Physical  Exam Updated Vital Signs   Physical Exam  Constitutional: She is oriented to person, place, and time. She appears well-developed and well-nourished. No distress.  HENT:  Head: Normocephalic and atraumatic.  Right Ear: External ear normal.  Left Ear: External ear normal.  Nose: Nose normal.  Eyes: Conjunctivae and EOM are normal. No scleral icterus.  Neck: Normal range of motion and phonation normal.  Cardiovascular: Normal rate and regular rhythm.   Pulmonary/Chest: Effort normal. No stridor. No respiratory distress.  Abdominal: She exhibits no distension. There is no tenderness.  Genitourinary: Pelvic exam was performed with patient supine. There is no rash or lesion on the right labia. There is no rash or lesion on the left labia.  Genitourinary Comments: Chaperone present during pelvic exam.   Musculoskeletal: Normal range of motion. She exhibits no edema.  Neurological: She is alert and oriented to person, place, and time.  Skin: She is not diaphoretic.  Psychiatric: She has a normal mood and affect. Her behavior is normal.  Vitals reviewed.    ED Treatments / Results  Labs (all labs ordered are listed, but only abnormal results are displayed) Labs Reviewed  WET PREP, GENITAL - Abnormal; Notable for the following:       Result Value   Clue Cells Wet Prep HPF POC PRESENT (*)  WBC, Wet Prep HPF POC FEW (*)    All other components within normal limits  HIV ANTIBODY (ROUTINE TESTING)  GC/CHLAMYDIA PROBE AMP ([place]) NOT AT [facility]    EKG  EKG Interpretation None       Radiology No results found.  Procedures Procedures (including critical care time)  Medications Ordered in ED Medications - No data to display   Initial Impression / Assessment and Plan / ED Course  I have reviewed the triage vital signs and the nursing notes.  Pertinent labs & imaging results that were available during my care of the patient were reviewed by me and considered in my medical  decision making (see chart for details).     Possible STD exposure. Plan sweep for gonorrhea/chlamydia and wet prep. Abdomen benign. No lesions noted.  Wet prep without evidence of Trichomonas. It did reveal positive clue cells but patient is currently asymptomatic, no treatment indicated at this time.  Also sent HIV and RPR.  The patient is safe for discharge with strict return precautions.   Final Clinical Impressions(s) / ED Diagnoses   Final diagnoses:  Possible exposure to STD   Disposition: Discharge  Condition: Good  I have discussed the results, Dx and Tx plan with the patient who expressed understanding and agree(s) with the plan. Discharge instructions discussed at great length. The patient was given strict return precautions who verbalized understanding of the instructions. No further questions at time of discharge.    New Prescriptions   No medications on file    Follow Up: Health Department         Nira Conn, MD 11/11/16 2116

## 2016-11-12 LAB — GC/CHLAMYDIA PROBE AMP (~~LOC~~) NOT AT ARMC
Chlamydia: POSITIVE — AB
Neisseria Gonorrhea: NEGATIVE

## 2016-11-13 ENCOUNTER — Telehealth: Payer: Self-pay | Admitting: Medical

## 2016-11-13 DIAGNOSIS — A749 Chlamydial infection, unspecified: Secondary | ICD-10-CM

## 2016-11-13 MED ORDER — AZITHROMYCIN 250 MG PO TABS: 1000.0000 mg | ORAL_TABLET | Freq: Once | ORAL | 0 refills | Status: AC

## 2016-11-13 NOTE — Telephone Encounter (Addendum)
Yvette Perez tested positive for  Chlamydia. Patient was called by RN and allergies and pharmacy confirmed. Rx sent to pharmacy of choice.   Marny Lowenstein, PA-C 11/13/2016 11:50 AM      ----- Message from Kathe Becton, RN sent at 11/13/2016 11:05 AM EDT ----- This patient tested positive for chlamydia,   She ::"has NKDA"," I have informed the patient of her results and confirmed her pharmacy is correct in her chart. Please send Rx.   Thank you,   Kathe Becton, RN   Results faxed to [facility] Department.

## 2016-11-17 NOTE — ED Notes (Signed)
Pt. Called for results of blood work. Results have not resulted as of this date.  Will call [facility]. And call pt. Back.

## 2016-11-19 ENCOUNTER — Telehealth (HOSPITAL_BASED_OUTPATIENT_CLINIC_OR_DEPARTMENT_OTHER): Payer: Self-pay | Admitting: Emergency Medicine

## 2016-11-19 NOTE — Telephone Encounter (Signed)
Patient called today stating that she may have vomited antibiotics from 1 week prior after taking them.  States they made her sick on her stomach.  Informed patient she would need to check back in if she felt she needed more antibiotics and be rechecked.  Patient voiced understanding.

## 2016-12-18 ENCOUNTER — Emergency Department (HOSPITAL_BASED_OUTPATIENT_CLINIC_OR_DEPARTMENT_OTHER)
Admission: EM | Admit: 2016-12-18 | Discharge: 2016-12-18 | Disposition: A | Discharge status: Home / Self Care | Payer: BLUE CROSS/BLUE SHIELD | Attending: Emergency Medicine | Admitting: Emergency Medicine

## 2016-12-18 ENCOUNTER — Encounter (HOSPITAL_BASED_OUTPATIENT_CLINIC_OR_DEPARTMENT_OTHER): Payer: Self-pay | Admitting: Emergency Medicine

## 2016-12-18 DIAGNOSIS — F17210 Nicotine dependence, cigarettes, uncomplicated: Secondary | ICD-10-CM | POA: Insufficient documentation

## 2016-12-18 DIAGNOSIS — J029 Acute pharyngitis, unspecified: Secondary | ICD-10-CM | POA: Insufficient documentation

## 2016-12-18 LAB — RAPID STREP SCREEN (MED CTR MEBANE ONLY): Streptococcus, Group A Screen (Direct): NEGATIVE

## 2016-12-18 MED ORDER — DEXAMETHASONE 6 MG PO TABS
10.0000 mg | ORAL_TABLET | Freq: Once | ORAL | Status: AC
  Administered 2016-12-18: 10 mg via ORAL
  Filled 2016-12-18: qty 1

## 2016-12-18 MED ORDER — HYDROCODONE-ACETAMINOPHEN 7.5-325 MG/15ML PO SOLN: 10.0000 mL | Freq: Four times a day (QID) | ORAL | 0 refills | Status: AC | PRN

## 2016-12-18 MED FILL — HYDROCOD-APAP 7.5-325/15ML: 7.5-325 | 2 days supply | Qty: 80 | Fill #0

## 2016-12-18 NOTE — ED Notes (Signed)
ED Provider at bedside. 

## 2016-12-18 NOTE — ED Triage Notes (Signed)
Pt c/o sore throat x 2d

## 2016-12-18 NOTE — ED Provider Notes (Signed)
[facility]   CSN: [id number] Arrival date & time: 12/18/16  1619     History   Chief Complaint Chief Complaint  Patient presents with  . Sore Throat    HPI Yvette Perez is a 26 y.o. female who presents with a 2 day history of sore throat. Patient has been able to tolerate food and fluids. She has had associated headache and anterior, R sided neck pain. She denies any fevers, chest pain, shortness of breath, abdominal pain, nausea, vomiting, cough, nasal congestion. She has taken one dose of Bactrim that she got from a friend and ibuprofen. Patient reports she has had strep throat incidental several times, most recently in the past year.  HPI  Past Medical History:  Diagnosis Date  . Medical history non-contributory     Patient Active Problem List   Diagnosis Date Noted  . SVD (spontaneous vaginal delivery) 09/11/2012  . Postpartum care following vaginal delivery (6/21) 09/11/2012    Past Surgical History:  Procedure Laterality Date  . NO PAST SURGERIES      OB History    Gravida Para Term Preterm AB Living   SAB TAB Ectopic Multiple Live Births           1       Home Medications    Prior to Admission medications   Medication Sig Start Date End Date Taking? Authorizing Provider  HYDROcodone-acetaminophen (HYCET) 7.5-325 mg/15 ml solution Take 10 mLs by mouth 4 (four) times daily as needed for moderate pain. 12/18/16 12/21/16  Emi Holes, PA-C    Family History No family history on file.  Social History Social History  Substance Use Topics  . Smoking status: Current Every Day Smoker    Packs/day: 1.00    Types: Cigars, Cigarettes  . Smokeless tobacco: Never Used  . Alcohol use No     Allergies   Patient has no known allergies.   Review of Systems Review of Systems  Constitutional: Negative for fever.  HENT: Positive for sore throat. Negative for congestion.   Respiratory: Negative for cough and  shortness of breath.   Cardiovascular: Negative for chest pain.  Gastrointestinal: Negative for abdominal pain, nausea and vomiting.  Skin: Negative for rash and wound.  Neurological: Positive for headaches.     Physical Exam Updated Vital Signs BP (!) 125/93 (BP Location: Left Arm)   Pulse 92   Temp 99 F (37.2 C) (Oral)   Resp 16   Ht  (1.575 m)   Wt 60.7 kg (133 lb 14.4 oz)   SpO2 99%   BMI 24.49 kg/m   Physical Exam  Constitutional: She appears well-developed and well-nourished. No distress.  HENT:  Head: Normocephalic and atraumatic.  Right Ear: Tympanic membrane normal.  Left Ear: Tympanic membrane normal.  Mouth/Throat: Mucous membranes are normal. No trismus in the jaw. No uvula swelling. Posterior oropharyngeal edema and posterior oropharyngeal erythema present. No oropharyngeal exudate or tonsillar abscesses. Tonsils are 1+ on the right. Tonsils are 0 on the left. Tonsillar exudate (R only).  Eyes: Pupils are equal, round, and reactive to light. Conjunctivae are normal. Right eye exhibits no discharge. Left eye exhibits no discharge. No scleral icterus.  Neck: Normal range of motion and full passive range of motion without pain. Neck supple. No spinous process tenderness present. No thyromegaly present.  R sided tenderness to the cervical, anterior chain; no palpable lymph nodes  Cardiovascular:  Normal rate, regular rhythm, normal heart sounds and intact distal pulses.  Exam reveals no gallop and no friction rub.   No murmur heard. Pulmonary/Chest: Effort normal and breath sounds normal. No stridor. No respiratory distress. She has no wheezes. She has no rales.  Abdominal: Soft. Bowel sounds are normal. She exhibits no distension. There is no tenderness. There is no rebound and no guarding.  Musculoskeletal: She exhibits no edema.  Lymphadenopathy:    She has no cervical adenopathy.  Neurological: She is alert. Coordination normal.  Skin: Skin is warm and dry. No  rash noted. She is not diaphoretic. No pallor.  Psychiatric: She has a normal mood and affect.  Nursing note and vitals reviewed.    ED Treatments / Results  Labs (all labs ordered are listed, but only abnormal results are displayed) Labs Reviewed  RAPID STREP SCREEN (NOT AT [facility])  CULTURE, GROUP A STREP [facility])    EKG  EKG Interpretation None       Radiology No results found.  Procedures Procedures (including critical care time)  Medications Ordered in ED Medications  dexamethasone (DECADRON) tablet 10 mg (10 mg Oral Given 12/18/16 1707)     Initial Impression / Assessment and Plan / ED Course  I have reviewed the triage vital signs and the nursing notes.  Pertinent labs & imaging results that were available during my care of the patient were reviewed by me and considered in my medical decision making (see chart for details).     Pt with negative strep. Diagnosis of viral pharyngitis. No abx indicated at this time. Single Decadron dose given in the ED. Discussed that results of strep culture are pending and patient will be informed if positive result and abx will be called in at that time. Discharge with symptomatic tx, Hycet, ibuprofen, warm salt water gargles. I reviewed the [place] narcotic database and found no discrepancies.  No evidence of dehydration. Pt is tolerating secretions. Presentation not concerning for peritonsillar abscess or spread of infection to deep spaces of the throat; patent airway. Specific return precautions discussed. Recommended PCP/ENT follow up for further evaluation considering several recurrent infections. Patient vitals stable throughout ED course and discharged in satisfactory condition.  Final Clinical Impressions(s) / ED Diagnoses   Final diagnoses:  Sore throat    New Prescriptions New Prescriptions   HYDROCODONE-ACETAMINOPHEN (HYCET) 7.5-325 MG/15 ML SOLUTION    Take 10 mLs by mouth 4 (four) times daily as needed for moderate pain.      Emi Holes, PA-C 12/18/16 1717    Benjiman Core, MD 12/19/16 0000

## 2016-12-18 NOTE — Discharge Instructions (Signed)
Medications: Hycet  Treatment: Take 10mL Hycet every 4 hours as needed for moderate to severe pain. You can alternate with ibuprofen as prescribed over-the-counter.  Do not drink alcohol, drive, operate machinery or participate in any other potentially dangerous activities while taking opiate pain medication as it may make you sleepy. Do not take this medication with any other sedating medications, either prescription or over-the-counter. If you were prescribed Percocet or Vicodin, do not take these with acetaminophen (Tylenol) as it is already contained within these medications and overdose of Tylenol is dangerous.   This medication is an opiate (or narcotic) pain medication and can be habit forming.  Use it as little as possible to achieve adequate pain control.  Do not use or use it with extreme caution if you have a history of opiate abuse or dependence. This medication is intended for your use only - do not give any to anyone else and keep it in a secure place where nobody else, especially children, have access to it. It will also cause or worsen constipation, so you may want to consider taking an over-the-counter stool softener while you are taking this medication.  Follow-up: You will be called in 2-3 days if you strep test is positive and you need an antibiotic. Please return to the emergency department if you develop any new or worsening symptoms including mass in her neck, asymmetry in your throat, drooling, inability to open your mouth or "lock jaw", or any other new or concerning symptoms.

## 2016-12-21 LAB — CULTURE, GROUP A STREP (THRC)

## 2017-12-14 ENCOUNTER — Other Ambulatory Visit: Payer: Self-pay

## 2017-12-14 ENCOUNTER — Emergency Department (HOSPITAL_BASED_OUTPATIENT_CLINIC_OR_DEPARTMENT_OTHER)
Admission: EM | Admit: 2017-12-14 | Discharge: 2017-12-14 | Disposition: A | Discharge status: Home / Self Care | Payer: Self-pay | Attending: Emergency Medicine | Admitting: Emergency Medicine

## 2017-12-14 ENCOUNTER — Encounter (HOSPITAL_BASED_OUTPATIENT_CLINIC_OR_DEPARTMENT_OTHER): Payer: Self-pay | Admitting: Emergency Medicine

## 2017-12-14 DIAGNOSIS — Z79899 Other long term (current) drug therapy: Secondary | ICD-10-CM | POA: Insufficient documentation

## 2017-12-14 DIAGNOSIS — N3000 Acute cystitis without hematuria: Secondary | ICD-10-CM | POA: Insufficient documentation

## 2017-12-14 DIAGNOSIS — F17210 Nicotine dependence, cigarettes, uncomplicated: Secondary | ICD-10-CM | POA: Insufficient documentation

## 2017-12-14 LAB — URINALYSIS, ROUTINE W REFLEX MICROSCOPIC
BILIRUBIN URINE: NEGATIVE
Glucose, UA: NEGATIVE mg/dL
KETONES UR: NEGATIVE mg/dL
Nitrite: NEGATIVE
PROTEIN: NEGATIVE mg/dL
Specific Gravity, Urine: 1.015 (ref 1.005–1.030)
pH: 6.5 (ref 5.0–8.0)

## 2017-12-14 LAB — URINALYSIS, MICROSCOPIC (REFLEX)

## 2017-12-14 LAB — PREGNANCY, URINE: Preg Test, Ur: NEGATIVE

## 2017-12-14 MED ORDER — PHENAZOPYRIDINE HCL 200 MG PO TABS: 200.0000 mg | ORAL_TABLET | Freq: Three times a day (TID) | ORAL | 0 refills | Status: DC

## 2017-12-14 MED ORDER — CEPHALEXIN 500 MG PO CAPS: 500.0000 mg | ORAL_CAPSULE | Freq: Four times a day (QID) | ORAL | 0 refills | Status: DC

## 2017-12-14 MED ORDER — CEPHALEXIN 250 MG PO CAPS
500.0000 mg | ORAL_CAPSULE | Freq: Once | ORAL | Status: AC
  Administered 2017-12-14: 500 mg via ORAL
  Filled 2017-12-14: qty 2

## 2017-12-14 MED FILL — CEPHALEXIN 500 MG CAPSULE: 500 | 5 days supply | Qty: 20 | Fill #0

## 2017-12-14 NOTE — Discharge Instructions (Addendum)
It was our pleasure to provide your ER care today - we hope that you feel better.  Take keflex (antibiotic) as prescribed.   Take pyridium as need for bladder pain/spasm.  Take acetaminophen or ibuprofen as need.   Follow up with primary care doctor in 1 week if symptoms fail to improve/resolve.  Your blood pressure is high today - follow up with primary care doctor in 1-2 weeks for recheck.   Return to ER if worse, severe pain, persistent vomiting, other concern.

## 2017-12-14 NOTE — ED Triage Notes (Signed)
Dysuria x 2 days.  No fever.  Some urgency and frequency and lower abdominal pressure.

## 2017-12-14 NOTE — ED Provider Notes (Signed)
[facility] EMERGENCY DEPARTMENT Provider Note   CSN: [id number] Arrival date & time: 12/14/17  0850     History   Chief Complaint Chief Complaint  Patient presents with  . Dysuria    HPI Yvette Perez is a 27 y.o. female.  Patient c/o dysuria, urgency, suprapubic discomfort in the past 2 days. Symptoms moderate, persistent, similar to remote hx uti. No recent abx use. No back or flank pain. No fever or chills. No vaginal discharge or bleeding. Normal appetite. Having normal bms.   The history is provided by the patient.    Past Medical History:  Diagnosis Date  . Medical history non-contributory     Patient Active Problem List   Diagnosis Date Noted  . SVD (spontaneous vaginal delivery) 09/11/2012  . Postpartum care following vaginal delivery (6/21) 09/11/2012    Past Surgical History:  Procedure Laterality Date  . NO PAST SURGERIES       OB History    Gravida  1   Para  1   Term  1   Preterm      AB      Living  1     SAB      TAB      Ectopic      Multiple      Live Births  1            Home Medications    Prior to Admission medications   Medication Sig Start Date End Date Taking? Authorizing Provider  etonogestrel (NEXPLANON) 68 MG IMPL implant 1 each by Subdermal route once.   Yes [provider]    Family History No family history on file.  Social History Social History   Tobacco Use  . Smoking status: Current Every Day Smoker    Packs/day: 1.00    Types: Cigars, Cigarettes  . Smokeless tobacco: Never Used  Substance Use Topics  . Alcohol use: No  . Drug use: No     Allergies   Patient has no known allergies.   Review of Systems Review of Systems  Constitutional: Negative for fever.  HENT: Negative for sore throat.   Eyes: Negative for redness.  Respiratory: Negative for shortness of breath.   Cardiovascular: Negative for chest pain.  Gastrointestinal: Negative for vomiting.    Genitourinary: Positive for dysuria. Negative for flank pain, vaginal bleeding and vaginal discharge.  Musculoskeletal: Negative for back pain.  Skin: Negative for rash.  Neurological: Negative for headaches.  Hematological: Does not bruise/bleed easily.  Psychiatric/Behavioral: Negative for confusion.     Physical Exam Updated Vital Signs BP (!) 146/109 (BP Location: Left Arm)   Pulse 100   Temp 98.5 F (36.9 C) (Oral)   Resp 18   Ht 1.575 m (5\' 2" )   Wt 61.2 kg   SpO2 99%   BMI 24.69 kg/m   Physical Exam  Constitutional: She appears well-developed and well-nourished.  HENT:  Head: Atraumatic.  Eyes: Conjunctivae are normal. No scleral icterus.  Neck: Neck supple. No tracheal deviation present.  Cardiovascular: Normal rate.  Pulmonary/Chest: Effort normal. No respiratory distress.  Abdominal: Soft. Normal appearance and bowel sounds are normal. She exhibits no distension and no mass. There is no tenderness. There is no guarding.  Genitourinary:  Genitourinary Comments: No cva tenderness  Musculoskeletal: She exhibits no edema.  Neurological: She is alert.  Skin: Skin is warm and dry. No rash noted.  Psychiatric: She has a normal mood and affect.  Nursing note and  vitals reviewed.    ED Treatments / Results  Labs (all labs ordered are listed, but only abnormal results are displayed) Results for orders placed or performed during the hospital encounter of 12/14/17  Urinalysis, Routine w reflex microscopic  Result Value Ref Range   Color, Urine YELLOW YELLOW   APPearance HAZY (A) CLEAR   Specific Gravity, Urine 1.015 1.005 - 1.030   pH 6.5 5.0 - 8.0   Glucose, UA NEGATIVE NEGATIVE mg/dL   Hgb urine dipstick SMALL (A) NEGATIVE   Bilirubin Urine NEGATIVE NEGATIVE   Ketones, ur NEGATIVE NEGATIVE mg/dL   Protein, ur NEGATIVE NEGATIVE mg/dL   Nitrite NEGATIVE NEGATIVE   Leukocytes, UA MODERATE (A) NEGATIVE  Pregnancy, urine  Result Value Ref Range   Preg Test, Ur  NEGATIVE NEGATIVE  Urinalysis, Microscopic (reflex)  Result Value Ref Range   RBC / HPF 0-5 0 - 5 RBC/hpf   WBC, UA 21-50 0 - 5 WBC/hpf   Bacteria, UA MANY (A) NONE SEEN   Squamous Epithelial / LPF 0-5 0 - 5   Mucus PRESENT     EKG None  Radiology No results found.  Procedures Procedures (including critical care time)  Medications Ordered in ED Medications - No data to display   Initial Impression / Assessment and Plan / ED Course  I have reviewed the triage vital signs and the nursing notes.  Pertinent labs & imaging results that were available during my care of the patient were reviewed by me and considered in my medical decision making (see chart for details).  ua sent.   Confirmed nkda.   Labs reviewed - ua pos for uti.  Discussed labs w pt.  Keflex po.   rx for home.  Pt appears stable for d/c.     Final Clinical Impressions(s) / ED Diagnoses   Final diagnoses:  None    ED Discharge Orders    None       Cathren Laine, MD 12/14/17 1018

## 2018-01-07 ENCOUNTER — Encounter (HOSPITAL_BASED_OUTPATIENT_CLINIC_OR_DEPARTMENT_OTHER): Payer: Self-pay

## 2018-01-07 ENCOUNTER — Other Ambulatory Visit: Payer: Self-pay

## 2018-01-07 DIAGNOSIS — F17290 Nicotine dependence, other tobacco product, uncomplicated: Secondary | ICD-10-CM | POA: Insufficient documentation

## 2018-01-07 DIAGNOSIS — Y939 Activity, unspecified: Secondary | ICD-10-CM | POA: Insufficient documentation

## 2018-01-07 DIAGNOSIS — Y999 Unspecified external cause status: Secondary | ICD-10-CM | POA: Insufficient documentation

## 2018-01-07 DIAGNOSIS — S40011A Contusion of right shoulder, initial encounter: Secondary | ICD-10-CM | POA: Insufficient documentation

## 2018-01-07 DIAGNOSIS — Y929 Unspecified place or not applicable: Secondary | ICD-10-CM | POA: Insufficient documentation
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
## 2018-01-08 ENCOUNTER — Emergency Department (HOSPITAL_BASED_OUTPATIENT_CLINIC_OR_DEPARTMENT_OTHER)
Admission: EM | Admit: 2018-01-08 | Discharge: 2018-01-08 | Disposition: A | Discharge status: Home / Self Care | Payer: Self-pay | Attending: Emergency Medicine | Admitting: Emergency Medicine

## 2018-01-08 ENCOUNTER — Emergency Department (HOSPITAL_BASED_OUTPATIENT_CLINIC_OR_DEPARTMENT_OTHER): Payer: Self-pay

## 2018-01-08 DIAGNOSIS — S40011A Contusion of right shoulder, initial encounter: Secondary | ICD-10-CM
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
## 2018-01-08 NOTE — ED Notes (Signed)
Patient is A&Ox4.  No signs of distress noted.  Please see providers complete history and physical exam.  
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
## 2018-01-08 NOTE — ED Notes (Signed)
PT states understanding of care given, follow up care. PT ambulated from ED to car with a steady gait.  

## 2018-09-02 ENCOUNTER — Emergency Department (HOSPITAL_COMMUNITY): Payer: Self-pay

## 2018-09-02 ENCOUNTER — Encounter (HOSPITAL_COMMUNITY): Payer: Self-pay | Admitting: Emergency Medicine

## 2018-09-02 ENCOUNTER — Emergency Department (HOSPITAL_COMMUNITY)
Admission: EM | Admit: 2018-09-02 | Discharge: 2018-09-02 | Disposition: A | Discharge status: Home / Self Care | Payer: Self-pay | Attending: Emergency Medicine | Admitting: Emergency Medicine

## 2018-09-02 ENCOUNTER — Other Ambulatory Visit: Payer: Self-pay

## 2018-09-02 DIAGNOSIS — N200 Calculus of kidney: Secondary | ICD-10-CM

## 2018-09-02 DIAGNOSIS — F17210 Nicotine dependence, cigarettes, uncomplicated: Secondary | ICD-10-CM | POA: Insufficient documentation

## 2018-09-02 DIAGNOSIS — N132 Hydronephrosis with renal and ureteral calculous obstruction: Secondary | ICD-10-CM | POA: Insufficient documentation

## 2018-09-02 DIAGNOSIS — R10815 Periumbilic abdominal tenderness: Secondary | ICD-10-CM | POA: Insufficient documentation

## 2018-09-02 DIAGNOSIS — N1330 Unspecified hydronephrosis: Secondary | ICD-10-CM | POA: Insufficient documentation

## 2018-09-02 DIAGNOSIS — R10813 Right lower quadrant abdominal tenderness: Secondary | ICD-10-CM | POA: Insufficient documentation

## 2018-09-02 DIAGNOSIS — R112 Nausea with vomiting, unspecified: Secondary | ICD-10-CM | POA: Insufficient documentation

## 2018-09-02 DIAGNOSIS — R1030 Lower abdominal pain, unspecified: Secondary | ICD-10-CM

## 2018-09-02 LAB — WET PREP, GENITAL
Sperm: NONE SEEN
Trich, Wet Prep: NONE SEEN
Yeast Wet Prep HPF POC: NONE SEEN

## 2018-09-02 LAB — COMPREHENSIVE METABOLIC PANEL
ALT: 14 U/L (ref 0–44)
AST: 14 U/L — ABNORMAL LOW (ref 15–41)
Albumin: 4 g/dL (ref 3.5–5.0)
Alkaline Phosphatase: 54 U/L (ref 38–126)
Anion gap: 11 (ref 5–15)
BUN: 7 mg/dL (ref 6–20)
CO2: 24 mmol/L (ref 22–32)
Calcium: 9.8 mg/dL (ref 8.9–10.3)
Chloride: 107 mmol/L (ref 98–111)
Creatinine, Ser: 0.87 mg/dL (ref 0.44–1.00)
GFR calc Af Amer: >60 mL/min (ref >60)
GFR calc non Af Amer: >60 mL/min (ref >60)
Glucose, Bld: 117 mg/dL — ABNORMAL HIGH (ref 70–99)
Potassium: 3.7 mmol/L (ref 3.5–5.1)
Sodium: 142 mmol/L (ref 135–145)
Total Bilirubin: 0.4 mg/dL (ref 0.3–1.2)
Total Protein: 7.3 g/dL (ref 6.5–8.1)

## 2018-09-02 LAB — URINALYSIS, ROUTINE W REFLEX MICROSCOPIC
Bilirubin Urine: NEGATIVE
Bilirubin Urine: NEGATIVE
Glucose, UA: NEGATIVE mg/dL
Glucose, UA: NEGATIVE mg/dL
Hgb urine dipstick: NEGATIVE
Hgb urine dipstick: NEGATIVE
Ketones, ur: NEGATIVE mg/dL
Ketones, ur: NEGATIVE mg/dL
Leukocytes,Ua: NEGATIVE
Nitrite: POSITIVE — AB
Nitrite: NEGATIVE
Protein, ur: 30 mg/dL — AB
Protein, ur: NEGATIVE mg/dL
Specific Gravity, Urine: 1.014 (ref 1.005–1.030)
Specific Gravity, Urine: 1.034 — ABNORMAL HIGH (ref 1.005–1.030)
pH: 6 (ref 5.0–8.0)
pH: 7 (ref 5.0–8.0)

## 2018-09-02 LAB — I-STAT BETA HCG BLOOD, ED (MC, WL, AP ONLY): I-stat hCG, quantitative: <5 m[IU]/mL (ref <5)

## 2018-09-02 LAB — CBC WITH DIFFERENTIAL/PLATELET
Abs Immature Granulocytes: 0.06 10*3/uL (ref 0.00–0.07)
Basophils Absolute: 0 10*3/uL (ref 0.0–0.1)
Basophils Relative: 0 %
Eosinophils Absolute: 0.1 10*3/uL (ref 0.0–0.5)
Eosinophils Relative: 1 %
HCT: 40.5 % (ref 36.0–46.0)
Hemoglobin: 12.6 g/dL (ref 12.0–15.0)
Immature Granulocytes: 1 %
Lymphocytes Relative: 9 %
Lymphs Abs: 1.1 10*3/uL (ref 0.7–4.0)
MCH: 26.3 pg (ref 26.0–34.0)
MCHC: 31.1 g/dL (ref 30.0–36.0)
MCV: 84.6 fL (ref 80.0–100.0)
Monocytes Absolute: 0.9 10*3/uL (ref 0.1–1.0)
Monocytes Relative: 7 %
Neutro Abs: 10.4 10*3/uL — ABNORMAL HIGH (ref 1.7–7.7)
Neutrophils Relative %: 82 %
Platelets: 279 10*3/uL (ref 150–400)
RBC: 4.79 MIL/uL (ref 3.87–5.11)
RDW: 13.8 % (ref 11.5–15.5)
WBC: 12.6 10*3/uL — ABNORMAL HIGH (ref 4.0–10.5)
nRBC: 0 % (ref 0.0–0.2)

## 2018-09-02 LAB — LIPASE, BLOOD: Lipase: 26 U/L (ref 11–51)

## 2018-09-02 MED ORDER — SODIUM CHLORIDE 0.9 % IV SOLN
1.0000 g | Freq: Once | INTRAVENOUS | Status: AC
  Administered 2018-09-02: 1 g via INTRAVENOUS
  Filled 2018-09-02: qty 10

## 2018-09-02 MED ORDER — TAMSULOSIN HCL 0.4 MG PO CAPS: 0.4000 mg | ORAL_CAPSULE | Freq: Every day | ORAL | 0 refills | Status: AC

## 2018-09-02 MED ORDER — HYDROCODONE-ACETAMINOPHEN 5-325 MG PO TABS: 1.0000 | ORAL_TABLET | Freq: Three times a day (TID) | ORAL | 0 refills | Status: DC | PRN

## 2018-09-02 MED ORDER — CEPHALEXIN 500 MG PO CAPS: 500.0000 mg | ORAL_CAPSULE | Freq: Two times a day (BID) | ORAL | 0 refills | Status: AC

## 2018-09-02 MED ORDER — ONDANSETRON HCL 4 MG/2ML IJ SOLN
4.0000 mg | Freq: Once | INTRAMUSCULAR | Status: AC
  Administered 2018-09-02: 4 mg via INTRAVENOUS
  Filled 2018-09-02: qty 2

## 2018-09-02 MED ORDER — ONDANSETRON 4 MG PO TBDP: 4.0000 mg | ORAL_TABLET | Freq: Three times a day (TID) | ORAL | 0 refills | Status: DC | PRN

## 2018-09-02 MED ORDER — MORPHINE SULFATE (PF) 4 MG/ML IV SOLN
4.0000 mg | Freq: Once | INTRAVENOUS | Status: AC
  Administered 2018-09-02: 4 mg via INTRAVENOUS
  Filled 2018-09-02: qty 1

## 2018-09-02 MED ORDER — IOHEXOL 300 MG/ML  SOLN
100.0000 mL | Freq: Once | INTRAMUSCULAR | Status: AC | PRN
  Administered 2018-09-02: 100 mL via INTRAVENOUS

## 2018-09-02 NOTE — ED Triage Notes (Signed)
Pt arrives [facility] with complaints right lower quadrant abdominal pain X3 days. Pt endorses emesis X1 with EMS. Pt is alert and oriented X4.

## 2018-09-02 NOTE — Discharge Instructions (Signed)
You can take Tylenol or Ibuprofen as directed for pain. You can alternate Tylenol and Ibuprofen every 4 hours. If you take Tylenol at 1pm, then you can take Ibuprofen at 5pm. Then you can take Tylenol again at 9pm.   Zofran for nausea/vomiting.    Take pain medications as directed for break through pain. Do not drive or operate machinery while taking this medication.   Take Flomax as directed.  Take antibiotics as directed. Please take all of your antibiotics until finished.  Low up with referred urologist.  Return emergency department for any fevers, worsening pain, vomiting or any other worsening or concerning symptoms.

## 2018-09-02 NOTE — ED Provider Notes (Signed)
[facility]   CSN: [id number] Arrival date & time: 09/02/18  1425    History   Chief Complaint Chief Complaint  Patient presents with  . Abdominal Pain    HPI Yvette Perez is a 28 y.o. female presents today for evaluation of acute onset, progressively worsening lower abdominal pain for 3 days.  She reports constant sharp pain which worsens with sitting upright and ambulating.  Pain is worse in the left lower quadrant.  She has had one episode of nonbloody nonbilious emesis earlier today.  Denies diarrhea or constipation.  Denies vaginal itching, bleeding, or discharge.  Does report some urinary urgency and frequency.  Has tried taking cranberry pills because she thought she was having a UTI with no relief in her symptoms.  Denies fevers, chills, chest pain, shortness of breath.     The history is provided by the patient.    Past Medical History:  Diagnosis Date  . Medical history non-contributory     Patient Active Problem List   Diagnosis Date Noted  . SVD (spontaneous vaginal delivery) 09/11/2012  . Postpartum care following vaginal delivery (6/21) 09/11/2012    Past Surgical History:  Procedure Laterality Date  . NO PAST SURGERIES       OB History    Gravida  1   Para  1   Term  1   Preterm      AB      Living  1     SAB      TAB      Ectopic      Multiple      Live Births  1            Home Medications    Prior to Admission medications   Medication Sig Start Date End Date Taking? Authorizing Provider  etonogestrel (NEXPLANON) 68 MG IMPL implant 1 each by Subdermal route once.   Yes [provider]    Family History No family history on file.  Social History Social History   Tobacco Use  . Smoking status: Current Every Day Smoker    Packs/day: 1.00    Types: Cigars, Cigarettes  . Smokeless tobacco: Never Used  Substance Use Topics  . Alcohol use: No  . Drug use: No      Allergies   Patient has no known allergies.   Review of Systems Review of Systems  Constitutional: Negative for chills and fever.  Respiratory: Negative for cough and shortness of breath.   Cardiovascular: Negative for chest pain.  Gastrointestinal: Positive for abdominal pain, nausea and vomiting. Negative for constipation and diarrhea.  Genitourinary: Positive for frequency and urgency. Negative for dysuria, hematuria, vaginal bleeding, vaginal discharge and vaginal pain.  All other systems reviewed and are negative.    Physical Exam Updated Vital Signs BP (!) 150/90 (BP Location: Right Arm)   Pulse 97   Temp 98.7 F (37.1 C) (Oral)   Resp 15   Ht 5\' 2"  (1.575 m)   Wt 65.8 kg   SpO2 100%   BMI 26.52 kg/m   Physical Exam Vitals signs and nursing note reviewed. Exam conducted with a chaperone present.  Constitutional:      General: She is not in acute distress.    Appearance: She is well-developed.  HENT:     Head: Normocephalic and atraumatic.  Eyes:     General:        Right eye: No discharge.  Left eye: No discharge.     Conjunctiva/sclera: Conjunctivae normal.  Neck:     Vascular: No JVD.     Trachea: No tracheal deviation.  Cardiovascular:     Rate and Rhythm: Normal rate.  Pulmonary:     Effort: Pulmonary effort is normal.  Abdominal:     General: There is no distension.     Tenderness: There is abdominal tenderness in the right lower quadrant, suprapubic area and left lower quadrant. There is no right CVA tenderness, left CVA tenderness, guarding or rebound. Negative signs include Murphy's sign, Rovsing's sign and McBurney's sign.  Genitourinary:    Cervix: No cervical motion tenderness.     Adnexa:        Right: No tenderness.         Left: No tenderness.       Comments: Examination performed in the presence of a chaperone.  No masses or lesions to the external genitalia.  Moderate amount of thin white discharge in the vaginal vault.  No  cervical motion tenderness or adnexal tenderness. Skin:    General: Skin is warm and dry.     Findings: No erythema.  Neurological:     Mental Status: She is alert.  Psychiatric:        Behavior: Behavior normal.      ED Treatments / Results  Labs (all labs ordered are listed, but only abnormal results are displayed) Labs Reviewed  WET PREP, GENITAL - Abnormal; Notable for the following components:      Result Value   Clue Cells Wet Prep HPF POC PRESENT (*)    WBC, Wet Prep HPF POC MANY (*)    All other components within normal limits  CBC WITH DIFFERENTIAL/PLATELET - Abnormal; Notable for the following components:   WBC 12.6 (*)    Neutro Abs 10.4 (*)    All other components within normal limits  COMPREHENSIVE METABOLIC PANEL - Abnormal; Notable for the following components:   Glucose, Bld 117 (*)    AST 14 (*)    All other components within normal limits  URINALYSIS, ROUTINE W REFLEX MICROSCOPIC - Abnormal; Notable for the following components:   Color, Urine AMBER (*)    APPearance CLOUDY (*)    Protein, ur 30 (*)    Nitrite POSITIVE (*)    Leukocytes,Ua SMALL (*)    Bacteria, UA FEW (*)    All other components within normal limits  URINE CULTURE  LIPASE, BLOOD  RPR  HIV ANTIBODY (ROUTINE TESTING W REFLEX)  I-STAT BETA HCG BLOOD, ED ([facility], [facility], [facility] ONLY)  GC/CHLAMYDIA PROBE AMP ([place]) NOT [facility]    EKG None  Radiology No results found.  Procedures Procedures (including critical care time)  Medications Ordered in ED Medications  morphine 4 MG/ML injection 4 mg (has no administration in time range)  ondansetron (ZOFRAN) injection 4 mg (4 mg Intravenous Given 09/02/18 1525)  iohexol (OMNIPAQUE) 300 MG/ML solution 100 mL (100 mLs Intravenous Contrast Given 09/02/18 1609)     Initial Impression / Assessment and Plan / ED Course  I have reviewed the triage vital signs and the nursing notes.  Pertinent labs & imaging results that were available during my  care of the patient were reviewed by me and considered in my medical decision making (see chart for details).        Patient presenting for evaluation of lower abdominal pain for 3 days with nausea and vomiting.  She is afebrile, mildly hypertensive on  initial assessment.  She is nontoxic in appearance.  No peritoneal signs on examination of the abdomen.  Pelvic examination does not suggest PID.  She does have some clue cells and WBCs on her wet prep however this is likely physiologic and she has no complaints of vaginal itching, bleeding, or discharge.  Her UA suggest UTI but is not a clean-catch.  We will culture.  She is complaining of some urinary symptoms so could be reasonable to treat for UTI.  Will obtain CT of the abdomen and pelvis to rule out acute surgical abdominal pathology.  Lab work reviewed by me shows mild leukocytosis, no anemia, no metabolic derangements.  No renal insufficiency.  4:18 PM Signed out to oncoming provider PA [place].  Awaiting CT scan.  If no acute surgical abdominal pathology on imaging and patient is tolerating p.o. without difficulty likely stable for discharge home with treatment for UTI and symptomatic management.  If she has any other concerning findings, she may require further consultation or even admission to the hospital.  Final Clinical Impressions(s) / ED Diagnoses   Final diagnoses:  Lower abdominal pain  Non-intractable vomiting with nausea, unspecified vomiting type    ED Discharge Orders    None       Debroah Baller 09/02/18 1618    Lajean Saver, MD 09/04/18 1426

## 2018-09-02 NOTE — ED Provider Notes (Signed)
Care assumed from  [facility], PA-C at shift change with CT abd/pelvis pending.   In brief, this patient is a 28 y.o. F presents for evaluation of worsening lower abdominal pain x3 days.  She reports a constant sharp pain associated with some nonbloody, nonbilious vomiting.  No fevers noted.  Patient states that she was having some urinary frequency and thought she might have a UTI started taking some cranberry pills.  No fevers.  Please see note from previous provider for full history/physical exam.    Physical Exam  BP (!) 150/90 (BP Location: Right Arm)   Pulse 97   Temp 98.7 F (37.1 C) (Oral)   Resp 15   Ht 5\' 2"  (1.575 m)   Wt 65.8 kg   SpO2 100%   BMI 26.52 kg/m   Physical Exam   Abdomen soft, nondistended.  Mild tenderness in the left lower quadrant.  No CVA tenderness noted bilaterally.  ED Course/Procedures     Procedures  MDM   I-STAT beta negative.  CBC does show leukocytosis.  UA does show nitrites, leukocytes, and small amount of pyuria though she does have significant squamous epithelium so question contaminant.  Per previous PA, pelvic exam is unremarkable.  No vaginal discharge that would be concerning for BV.  No findings consistent with PID.  She is pending the abdomen pelvis.  If unremarkable, plan to treat for UTI.  CT shows a 3 mm kidney stone noted at the left UVJ with some mild left hydronephrosis and hydroureter.  Given you a findings, will plan to give a dose of Rocephin here in the ED and consult with urology.  No evidence of appendicitis noted on exam.  Discussed patient with Dr. Leroy Kennedy (Urology).  Recommends obtaining a UA with in and out cath discussing after UA has resulted.  Repeat UA shows no evidence of nitrites, leukocytes.  Discussed patient with Dr. Noah Delaine (Urology).  Agrees with plan for discharge home and outpatient follow-up with urology.  Appreciate his consult.  Discussed results with patient.  Will plan to send patient home with  pain medications, nausea medication.  Additionally, given that she is symptomatic of UTI, plan to treat.  Urine culture sent.  Patient reports improvement in pain.  She has not been able to tolerate p.o. here in the department any difficulty.  Vitals are stable.  Instructed patient to follow-up with outpatient neurology as directed. At this time, patient exhibits no emergent life-threatening condition that require further evaluation in ED or admission. Patient had ample opportunity for questions and discussion. All patient's questions were answered with full understanding. Strict return precautions discussed. Patient expresses understanding and agreement to plan.   Portions of this note were generated with Lobbyist. Dictation errors may occur despite best attempts at proofreading.   1. Lower abdominal pain   2. Non-intractable vomiting with nausea, unspecified vomiting type      Volanda Napoleon, PA-C 09/02/18 [place], [place], DO 09/02/18 1931

## 2018-09-03 LAB — HIV ANTIBODY (ROUTINE TESTING W REFLEX): HIV Screen 4th Generation wRfx: NONREACTIVE

## 2018-09-03 LAB — GC/CHLAMYDIA PROBE AMP (~~LOC~~) NOT AT ARMC
Chlamydia: NEGATIVE
Neisseria Gonorrhea: NEGATIVE

## 2018-09-03 LAB — RPR: RPR Ser Ql: NONREACTIVE

## 2018-09-04 LAB — URINE CULTURE: Culture: <10000 — AB

## 2019-08-18 ENCOUNTER — Other Ambulatory Visit: Payer: Self-pay

## 2019-08-18 ENCOUNTER — Encounter (HOSPITAL_COMMUNITY): Payer: Self-pay | Admitting: *Deleted

## 2019-08-18 ENCOUNTER — Telehealth (HOSPITAL_COMMUNITY): Payer: Self-pay | Admitting: Emergency Medicine

## 2019-08-18 ENCOUNTER — Emergency Department (HOSPITAL_COMMUNITY)
Admission: EM | Admit: 2019-08-18 | Discharge: 2019-08-18 | Disposition: A | Discharge status: Home / Self Care | Payer: 59 | Attending: Emergency Medicine | Admitting: Emergency Medicine

## 2019-08-18 DIAGNOSIS — N3090 Cystitis, unspecified without hematuria: Secondary | ICD-10-CM | POA: Diagnosis not present

## 2019-08-18 DIAGNOSIS — R300 Dysuria: Secondary | ICD-10-CM | POA: Diagnosis present

## 2019-08-18 DIAGNOSIS — F17210 Nicotine dependence, cigarettes, uncomplicated: Secondary | ICD-10-CM | POA: Diagnosis not present

## 2019-08-18 DIAGNOSIS — Z79899 Other long term (current) drug therapy: Secondary | ICD-10-CM | POA: Diagnosis not present

## 2019-08-18 HISTORY — DX: Urinary tract infection, site not specified: N39.0

## 2019-08-18 HISTORY — DX: Disorder of kidney and ureter, unspecified: N28.9

## 2019-08-18 LAB — URINALYSIS, ROUTINE W REFLEX MICROSCOPIC
Bilirubin Urine: NEGATIVE
Glucose, UA: NEGATIVE mg/dL
Ketones, ur: NEGATIVE mg/dL
Nitrite: NEGATIVE
Protein, ur: 30 mg/dL — AB
RBC / HPF: >50 RBC/hpf — ABNORMAL HIGH (ref 0–5)
Specific Gravity, Urine: 1.015 (ref 1.005–1.030)
WBC, UA: >50 WBC/hpf — ABNORMAL HIGH (ref 0–5)
pH: 6 (ref 5.0–8.0)

## 2019-08-18 LAB — PREGNANCY, URINE: Preg Test, Ur: NEGATIVE

## 2019-08-18 MED ORDER — ONDANSETRON 4 MG PO TBDP: 4.0000 mg | ORAL_TABLET | Freq: Three times a day (TID) | ORAL | 0 refills | Status: DC | PRN

## 2019-08-18 MED ORDER — CEPHALEXIN 500 MG PO CAPS: 500.0000 mg | ORAL_CAPSULE | Freq: Three times a day (TID) | ORAL | 0 refills | Status: DC

## 2019-08-18 MED ORDER — CEPHALEXIN 500 MG PO CAPS: 500.0000 mg | ORAL_CAPSULE | Freq: Three times a day (TID) | ORAL | 0 refills | Status: AC

## 2019-08-18 NOTE — ED Provider Notes (Signed)
[facility] DEPT Provider Note   CSN: [id number] Arrival date & time: 08/18/19  0749     History Chief Complaint  Patient presents with  . Vaginal Bleeding  . Urinary Tract Infection    Yvette Perez is a 29 y.o. female.  HPI  Patient is a 29 year old female with no pertinent past medical history apart from frequent UTIs.  She states that over the past 2 days she has had dysuria, frequency and urgency.  She denies any abdominal pain, nausea, vomiting, fevers, chills, flank pain.  Denies any history of kidney stones.  Denies any other symptoms.  She states that this feels similar to a urinary tract infection she had in the past.  She states that she sees an OB/GYN for most of her medical issues has no PCP.  She states that she noticed some vaginal spotting yesterday and today.  Patient has any vaginal discharge, dyspareunia, fevers, chills, flank pain, hesitancy, history of kidney stones.  She states that she is feeling well tolerated p.o. without difficulty and states that this feels very similar to complicated urinary tract infections that she had in the past.     Past Medical History:  Diagnosis Date  . Medical history non-contributory   . Renal disorder   . UTI (urinary tract infection)     Patient Active Problem List   Diagnosis Date Noted  . SVD (spontaneous vaginal delivery) 09/11/2012  . Postpartum care following vaginal delivery (6/21) 09/11/2012    Past Surgical History:  Procedure Laterality Date  . NO PAST SURGERIES       OB History    Gravida  1   Para  1   Term  1   Preterm      AB      Living  1     SAB      TAB      Ectopic      Multiple      Live Births  1           No family history on file.  Social History   Tobacco Use  . Smoking status: Current Every Day Smoker    Packs/day: 1.00    Types: Cigars, Cigarettes  . Smokeless tobacco: Never Used  Substance Use Topics  . Alcohol use: No  .  Drug use: No    Home Medications Prior to Admission medications   Medication Sig Start Date End Date Taking? Authorizing Provider  cephALEXin (KEFLEX) 500 MG capsule Take 1 capsule (500 mg total) by mouth 3 (three) times daily for 7 days. 08/18/19 08/25/19  Tedd Sias, PA  etonogestrel (NEXPLANON) 68 MG IMPL implant 1 each by Subdermal route once.    [provider]  HYDROcodone-acetaminophen (NORCO/VICODIN) 5-325 MG tablet Take 1-2 tablets by mouth every 8 (eight) hours as needed. 09/02/18   Volanda Napoleon, PA-C  ondansetron (ZOFRAN ODT) 4 MG disintegrating tablet Take 1 tablet (4 mg total) by mouth every 8 (eight) hours as needed for nausea or vomiting. 09/02/18   Volanda Napoleon, PA-C  ondansetron (ZOFRAN ODT) 4 MG disintegrating tablet Take 1 tablet (4 mg total) by mouth every 8 (eight) hours as needed for nausea or vomiting. 08/18/19   Tedd Sias, PA    Allergies    Patient has no known allergies.  Review of Systems   Review of Systems  Constitutional: Negative for fever.  HENT: Negative for congestion.   Respiratory: Negative for shortness of breath.  Cardiovascular: Negative for chest pain.  Gastrointestinal: Negative for abdominal distention, abdominal pain, constipation, diarrhea, nausea and vomiting.  Genitourinary: Positive for dysuria, frequency and vaginal bleeding. Negative for dyspareunia, flank pain, vaginal discharge and vaginal pain.  Neurological: Negative for dizziness and headaches.    Physical Exam Updated Vital Signs BP (!) 149/102   Pulse 86   Temp 98.3 F (36.8 C) (Oral)   Resp 18   Ht 5\' 2"  (1.575 m)   Wt 65.8 kg   SpO2 100%   BMI 26.52 kg/m   Physical Exam Vitals and nursing note reviewed.  Constitutional:      General: She is not in acute distress. HENT:     Head: Normocephalic and atraumatic.     Nose: Nose normal.  Eyes:     General: No scleral icterus. Cardiovascular:     Rate and Rhythm: Normal rate and regular  rhythm.     Pulses: Normal pulses.     Heart sounds: Normal heart sounds.  Pulmonary:     Effort: Pulmonary effort is normal. No respiratory distress.     Breath sounds: No wheezing.  Abdominal:     Palpations: Abdomen is soft.     Tenderness: There is no abdominal tenderness.     Comments: Nontender abdomen.  No guarding or rebound.  No CVA tenderness.  No suprapubic tenderness.  Musculoskeletal:     Cervical back: Normal range of motion.     Right lower leg: No edema.     Left lower leg: No edema.  Skin:    General: Skin is warm and dry.     Capillary Refill: Capillary refill takes less than 2 seconds.  Neurological:     Mental Status: She is alert. Mental status is at baseline.  Psychiatric:        Mood and Affect: Mood normal.        Behavior: Behavior normal.     ED Results / Procedures / Treatments   Labs (all labs ordered are listed, but only abnormal results are displayed) Labs Reviewed  URINALYSIS, ROUTINE W REFLEX MICROSCOPIC - Abnormal; Notable for the following components:      Result Value   Hgb urine dipstick MODERATE (*)    Protein, ur 30 (*)    Leukocytes,Ua LARGE (*)    RBC / HPF >50 (*)    WBC, UA >50 (*)    Bacteria, UA RARE (*)    Non Squamous Epithelial 0-5 (*)    All other components within normal limits  PREGNANCY, URINE  POC URINE PREG, ED    EKG None  Radiology No results found.  Procedures Procedures (including critical care time)  Medications Ordered in ED Medications - No data to display  ED Course  I have reviewed the triage vital signs and the nursing notes.  Pertinent labs & imaging results that were available during my care of the patient were reviewed by me and considered in my medical decision making (see chart for details).  Patient is 29 year old female no significant past medical history other than frequent UTIs.  States that she feels she has similar symptoms today.  Positive frequency urgency and dysuria.  She has had  some vaginal spotting however she is on Nexplanon.  Physical exam is unremarkable.  Doubt pyelonephritis.  Doubt ovarian torsion, ectopic pregnancy appendicitis or other acute intra-abdominal pathology.  Clinical Course as of Aug 18 1403  Thu Aug 18, 2019  1349 UA with mod Hgb likely 2/2 vaginal spotting.  Urinalysis with large leukocytes, large WBC, rare bacteria.  Consistent with UTI.  Symptoms are consistent with this as well.  She is not tachycardic has no severe tenderness.  Is afebrile well-appearing.  She is tolerating p.o. without difficulty.  Will discharge with Keflex 3 times daily for 7 days and follow-up with primary care doctor.  Her blood pressure is elevated today.  She is understanding this.  No other vital signs abnormalities.  She will follow up with PCP.   [WF]    Clinical Course User Index [WF] Gailen Shelter, [place]   Patient is tolerating p.o.  Will discharge at this time with Keflex and will provide patient with Zofran for she does experience nausea.  She already has Pyridium which she will use at home.  MDM Rules/Calculators/A&P                     Patient given strict return precautions.  Final Clinical Impression(s) / ED Diagnoses Final diagnoses:  Cystitis    Rx / DC Orders ED Discharge Orders         Ordered    cephALEXin (KEFLEX) 500 MG capsule  3 times daily     08/18/19 1352    ondansetron (ZOFRAN ODT) 4 MG disintegrating tablet  Every 8 hours PRN     08/18/19 1352           Gailen Shelter, [place] 08/18/19 1405    Geoffery Lyons, MD 08/20/19 [phone]

## 2019-08-18 NOTE — Discharge Instructions (Addendum)
Please take Keflex as prescribed.  Please use the nausea medication as needed for any nausea you may have.  Please follow-up with primary care doctor as we discussed your blood pressure is very high today.

## 2019-08-18 NOTE — ED Triage Notes (Signed)
Pt states last month treated for UTI, same symptoms with small amt of vag bleeding started yesterday.

## 2019-08-18 NOTE — Telephone Encounter (Cosign Needed)
Pt requested meds sent to [facility]

## 2020-07-13 ENCOUNTER — Emergency Department (HOSPITAL_COMMUNITY): Payer: Self-pay

## 2020-07-13 ENCOUNTER — Encounter (HOSPITAL_COMMUNITY): Payer: Self-pay | Admitting: Emergency Medicine

## 2020-07-13 ENCOUNTER — Other Ambulatory Visit: Payer: Self-pay

## 2020-07-13 ENCOUNTER — Emergency Department (HOSPITAL_COMMUNITY)
Admission: EM | Admit: 2020-07-13 | Discharge: 2020-07-13 | Disposition: A | Discharge status: Home / Self Care | Payer: Self-pay | Attending: Emergency Medicine | Admitting: Emergency Medicine

## 2020-07-13 DIAGNOSIS — W502XXA Accidental twist by another person, initial encounter: Secondary | ICD-10-CM | POA: Insufficient documentation

## 2020-07-13 DIAGNOSIS — S82124A Nondisplaced fracture of lateral condyle of right tibia, initial encounter for closed fracture: Secondary | ICD-10-CM | POA: Insufficient documentation

## 2020-07-13 DIAGNOSIS — Z20822 Contact with and (suspected) exposure to covid-19: Secondary | ICD-10-CM | POA: Insufficient documentation

## 2020-07-13 DIAGNOSIS — F17210 Nicotine dependence, cigarettes, uncomplicated: Secondary | ICD-10-CM | POA: Insufficient documentation

## 2020-07-13 DIAGNOSIS — S82131A Displaced fracture of medial condyle of right tibia, initial encounter for closed fracture: Secondary | ICD-10-CM

## 2020-07-13 DIAGNOSIS — F17290 Nicotine dependence, other tobacco product, uncomplicated: Secondary | ICD-10-CM | POA: Insufficient documentation

## 2020-07-13 LAB — CBC
HCT: 40.1 % (ref 36.0–46.0)
Hemoglobin: 12.8 g/dL (ref 12.0–15.0)
MCH: 26.7 pg (ref 26.0–34.0)
MCHC: 31.9 g/dL (ref 30.0–36.0)
MCV: 83.7 fL (ref 80.0–100.0)
Platelets: 306 10*3/uL (ref 150–400)
RBC: 4.79 MIL/uL (ref 3.87–5.11)
RDW: 13.9 % (ref 11.5–15.5)
WBC: 10.4 10*3/uL (ref 4.0–10.5)
nRBC: 0 % (ref 0.0–0.2)

## 2020-07-13 LAB — RESP PANEL BY RT-PCR (FLU A&B, COVID) ARPGX2
Influenza A by PCR: NEGATIVE
Influenza B by PCR: NEGATIVE
SARS Coronavirus 2 by RT PCR: NEGATIVE

## 2020-07-13 LAB — I-STAT BETA HCG BLOOD, ED (MC, WL, AP ONLY): I-stat hCG, quantitative: <5 m[IU]/mL (ref <5)

## 2020-07-13 MED ORDER — IBUPROFEN 800 MG PO TABS
800.0000 mg | ORAL_TABLET | Freq: Once | ORAL | Status: AC
  Administered 2020-07-13: 800 mg via ORAL
  Filled 2020-07-13: qty 1

## 2020-07-13 MED ORDER — OXYCODONE-ACETAMINOPHEN 5-325 MG PO TABS
2.0000 | ORAL_TABLET | Freq: Once | ORAL | Status: AC
  Administered 2020-07-13: 2 via ORAL
  Filled 2020-07-13: qty 2

## 2020-07-13 NOTE — Discharge Instructions (Signed)
Please use knee immobilizer Do not weight-bear Use crutches at all time Keep leg elevated Call Dr. Marshell Levan office today for appointment in 1 to 2 weeks.

## 2020-07-13 NOTE — ED Notes (Signed)
Pt discharged from this ED in stable condition at this time. All discharge instructions and follow up care reviewed with pt with no further questions at this time. Pt ambulatory with crutches, clear speech.

## 2020-07-13 NOTE — ED Triage Notes (Signed)
Patient complains of twisting her R knee this morning, states she twisted and turned the wrong way. Knee is swollen, patient is ambulatory with pain

## 2020-07-13 NOTE — Progress Notes (Signed)
Orthopedic Tech Progress Note Patient Details:  Jacaria Colburn [birth year] [id number]  Ortho Devices Type of Ortho Device: Knee Immobilizer,Crutches Ortho Device/Splint Location: Right Leg Ortho Device/Splint Interventions: Application   Post Interventions Patient Tolerated: Well Instructions Provided: Adjustment of device   Jadis Mika E Martita Brumm 07/13/2020, 10:48 AM

## 2020-07-13 NOTE — ED Provider Notes (Signed)
[facility]-EMERGENCY DEPT Provider Note   CSN: [id number] Arrival date & time: 07/13/20  0757     History Chief Complaint  Patient presents with  . Knee Injury    Yvette Perez is a 30 y.o. female.  HPI   30 year old female no significant past medical history presents today complaining of right knee pain.  She states that she twisted her knee this morning.  She did not fall and had no other injury.  She has had pain and swelling since that time.  She has not taken any medication.  She ports she has a history of high blood pressure but has not been treated.  She denies any history of sickle cell disease.  Denies pregnancy  Past Medical History:  Diagnosis Date  . Medical history non-contributory   . Renal disorder   . UTI (urinary tract infection)     Patient Active Problem List   Diagnosis Date Noted  . SVD (spontaneous vaginal delivery) 09/11/2012  . Postpartum care following vaginal delivery (6/21) 09/11/2012    Past Surgical History:  Procedure Laterality Date  . NO PAST SURGERIES       OB History    Gravida  1   Para  1   Term  1   Preterm      AB      Living  1     SAB      IAB      Ectopic      Multiple      Live Births  1           No family history on file.  Social History   Tobacco Use  . Smoking status: Current Every Day Smoker    Packs/day: 1.00    Types: Cigars, Cigarettes  . Smokeless tobacco: Never Used  Substance Use Topics  . Alcohol use: No  . Drug use: No    Home Medications Prior to Admission medications   Medication Sig Start Date End Date Taking? Authorizing Provider  cephALEXin (KEFLEX) 500 MG capsule Take 1 capsule (500 mg total) by mouth 3 (three) times daily. 08/18/19   Gailen Shelter, PA  etonogestrel (NEXPLANON) 68 MG IMPL implant 1 each by Subdermal route once.    [provider]  HYDROcodone-acetaminophen (NORCO/VICODIN) 5-325 MG tablet Take 1-2 tablets by mouth every 8  (eight) hours as needed. Patient not taking: Reported on 08/18/2019 09/02/18   Graciella Freer A, PA-C  ondansetron (ZOFRAN ODT) 4 MG disintegrating tablet Take 1 tablet (4 mg total) by mouth every 8 (eight) hours as needed for nausea or vomiting. Patient not taking: Reported on 08/18/2019 09/02/18   Graciella Freer A, PA-C  ondansetron (ZOFRAN ODT) 4 MG disintegrating tablet Take 1 tablet (4 mg total) by mouth every 8 (eight) hours as needed for nausea or vomiting. 08/18/19   Blanchie Dessert, Rodrigo Ran, PA  ondansetron (ZOFRAN ODT) 4 MG disintegrating tablet Take 1 tablet (4 mg total) by mouth every 8 (eight) hours as needed for nausea or vomiting. 08/18/19   Gailen Shelter, PA    Allergies    Patient has no known allergies.  Review of Systems   Review of Systems  All other systems reviewed and are negative.   Physical Exam Updated Vital Signs BP (!) 187/124 (BP Location: Left Arm)   Pulse (!) 102   Temp 98.3 F (36.8 C) (Oral)   Resp 16   Ht 1.575 m (5\' 2" )   Wt 68 kg  SpO2 100%   BMI 27.44 kg/m   Physical Exam Vitals and nursing note reviewed.  Constitutional:      General: She is not in acute distress.    Appearance: She is well-developed.  HENT:     Head: Normocephalic and atraumatic.     Right Ear: External ear normal.     Left Ear: External ear normal.     Nose: Nose normal.  Eyes:     Conjunctiva/sclera: Conjunctivae normal.     Pupils: Pupils are equal, round, and reactive to light.  Pulmonary:     Effort: Pulmonary effort is normal.  Musculoskeletal:        General: Normal range of motion.     Cervical back: Normal range of motion and neck supple.     Comments: Right knee with effusion and diffuse tenderness Pulses and sensation are intact distal to injury No evidence of ankle or hip trauma  Skin:    General: Skin is warm and dry.     Capillary Refill: Capillary refill takes less than 2 seconds.  Neurological:     Mental Status: She is alert and oriented to person,  place, and time.     Motor: No abnormal muscle tone.     Coordination: Coordination normal.  Psychiatric:        Behavior: Behavior normal.        Thought Content: Thought content normal.     ED Results / Procedures / Treatments   Labs (all labs ordered are listed, but only abnormal results are displayed) Labs Reviewed - No data to display  EKG None  Radiology DG Knee Complete 4 Views Right  Result Date: 07/13/2020 CLINICAL DATA:  Twisting injury with right knee pain EXAM: RIGHT KNEE - COMPLETE 4+ VIEW COMPARISON:  None. FINDINGS: Lateral tibial plateau fracture with knee joint effusion. No noted depression. No subluxation. IMPRESSION: Nondisplaced lateral plateau fracture with knee joint effusion. Electronically Signed   By: Marnee Spring M.D.   On: 07/13/2020 09:08    Procedures Procedures   Medications Ordered in ED Medications  ibuprofen (ADVIL) tablet 800 mg (800 mg Oral Given 07/13/20 0848)  oxyCODONE-acetaminophen (PERCOCET/ROXICET) 5-325 MG per tablet 2 tablet (2 tablets Oral Given 07/13/20 0848)    ED Course  I have reviewed the triage vital signs and the nursing notes.  Pertinent labs & imaging results that were available during my care of the patient were reviewed by me and considered in my medical decision making (see chart for details).    MDM Rules/Calculators/A&P                          30 year old female with twist injury to knee with tibial plateau fracture and joint effusion. Discussed with Dr. Yevette Edwards.  Plan place patient in knee immobilizer and use crutches.  She is to be nonweightbearing.  She is to call and follow-up with his office in 1 to 2 weeks Final Clinical Impression(s) / ED Diagnoses Final diagnoses:  Closed fracture of medial portion of right tibial plateau, initial encounter    Rx / DC Orders ED Discharge Orders    None       Margarita Grizzle, MD 07/13/20 1005

## 2020-12-13 ENCOUNTER — Encounter (HOSPITAL_COMMUNITY): Payer: Self-pay

## 2020-12-13 ENCOUNTER — Emergency Department (HOSPITAL_COMMUNITY): Payer: Self-pay

## 2020-12-13 ENCOUNTER — Emergency Department (HOSPITAL_COMMUNITY)
Admission: EM | Admit: 2020-12-13 | Discharge: 2020-12-13 | Disposition: A | Discharge status: Home / Self Care | Payer: Self-pay | Attending: Emergency Medicine | Admitting: Emergency Medicine

## 2020-12-13 ENCOUNTER — Other Ambulatory Visit: Payer: Self-pay

## 2020-12-13 DIAGNOSIS — F17210 Nicotine dependence, cigarettes, uncomplicated: Secondary | ICD-10-CM | POA: Insufficient documentation

## 2020-12-13 DIAGNOSIS — R509 Fever, unspecified: Secondary | ICD-10-CM | POA: Insufficient documentation

## 2020-12-13 DIAGNOSIS — R112 Nausea with vomiting, unspecified: Secondary | ICD-10-CM | POA: Insufficient documentation

## 2020-12-13 DIAGNOSIS — R319 Hematuria, unspecified: Secondary | ICD-10-CM | POA: Insufficient documentation

## 2020-12-13 DIAGNOSIS — R109 Unspecified abdominal pain: Secondary | ICD-10-CM | POA: Insufficient documentation

## 2020-12-13 LAB — COMPREHENSIVE METABOLIC PANEL
ALT: 16 U/L (ref 0–44)
AST: 14 U/L — ABNORMAL LOW (ref 15–41)
Albumin: 4.4 g/dL (ref 3.5–5.0)
Alkaline Phosphatase: 62 U/L (ref 38–126)
Anion gap: 8 (ref 5–15)
BUN: 8 mg/dL (ref 6–20)
CO2: 24 mmol/L (ref 22–32)
Calcium: 9.3 mg/dL (ref 8.9–10.3)
Chloride: 103 mmol/L (ref 98–111)
Creatinine, Ser: 0.73 mg/dL (ref 0.44–1.00)
GFR, Estimated: >60 mL/min (ref >60)
Glucose, Bld: 183 mg/dL — ABNORMAL HIGH (ref 70–99)
Potassium: 3 mmol/L — ABNORMAL LOW (ref 3.5–5.1)
Sodium: 135 mmol/L (ref 135–145)
Total Bilirubin: 0.4 mg/dL (ref 0.3–1.2)
Total Protein: 8 g/dL (ref 6.5–8.1)

## 2020-12-13 LAB — CBC WITH DIFFERENTIAL/PLATELET
Abs Immature Granulocytes: 0.06 10*3/uL (ref 0.00–0.07)
Basophils Absolute: 0 10*3/uL (ref 0.0–0.1)
Basophils Relative: 0 %
Eosinophils Absolute: 0.3 10*3/uL (ref 0.0–0.5)
Eosinophils Relative: 2 %
HCT: 39.1 % (ref 36.0–46.0)
Hemoglobin: 12.6 g/dL (ref 12.0–15.0)
Immature Granulocytes: 0 %
Lymphocytes Relative: 30 %
Lymphs Abs: 4.1 10*3/uL — ABNORMAL HIGH (ref 0.7–4.0)
MCH: 26.1 pg (ref 26.0–34.0)
MCHC: 32.2 g/dL (ref 30.0–36.0)
MCV: 81.1 fL (ref 80.0–100.0)
Monocytes Absolute: 0.8 10*3/uL (ref 0.1–1.0)
Monocytes Relative: 6 %
Neutro Abs: 8.2 10*3/uL — ABNORMAL HIGH (ref 1.7–7.7)
Neutrophils Relative %: 62 %
Platelets: 299 10*3/uL (ref 150–400)
RBC: 4.82 MIL/uL (ref 3.87–5.11)
RDW: 14.2 % (ref 11.5–15.5)
WBC: 13.5 10*3/uL — ABNORMAL HIGH (ref 4.0–10.5)
nRBC: 0 % (ref 0.0–0.2)

## 2020-12-13 LAB — URINALYSIS, ROUTINE W REFLEX MICROSCOPIC
Bilirubin Urine: NEGATIVE
Glucose, UA: NEGATIVE mg/dL
Ketones, ur: NEGATIVE mg/dL
Leukocytes,Ua: NEGATIVE
Nitrite: NEGATIVE
Protein, ur: 30 mg/dL — AB
RBC / HPF: >50 RBC/hpf — ABNORMAL HIGH (ref 0–5)
Specific Gravity, Urine: 1.015 (ref 1.005–1.030)
pH: 7.5 (ref 5.0–8.0)

## 2020-12-13 LAB — PREGNANCY, URINE: Preg Test, Ur: NEGATIVE

## 2020-12-13 MED ORDER — ONDANSETRON HCL 4 MG PO TABS: 4.0000 mg | ORAL_TABLET | Freq: Four times a day (QID) | ORAL | 0 refills | Status: AC

## 2020-12-13 MED ORDER — OXYCODONE-ACETAMINOPHEN 5-325 MG PO TABS: 1.0000 | ORAL_TABLET | Freq: Four times a day (QID) | ORAL | 0 refills | Status: AC | PRN

## 2020-12-13 NOTE — ED Provider Notes (Signed)
[facility]-EMERGENCY DEPT Provider Note   CSN: [id number] Arrival date & time: 12/13/20  0148     History Chief Complaint  Patient presents with   Flank Pain   Hematuria   Emesis    Manie Bealer is a 30 y.o. female.  30 y.o female with a PMH of UTI, Renal disorder presents to the ED with a chief complaint of right flank pain for the past day.  She reports similar symptoms in the past due to a prior history of kidney stones which these passed spontaneously.  She endorses having pain to the right flank without any radiation.  Reports the pain was so severe when it first occurred but it caused her to have a couple episodes of nonbilious, none bloody emesis.  It was exacerbated with palpation and movement.  Alleviated with lying down on his side.  Did not take any medication prior to arrival in the ED.  She also endorses a subjective fever.  Last menstrual period is unknown, as patient has a Nexplanon in place.  She denies any abdominal pain, vaginal bleeding, cough, or shortness of breath.     The history is provided by the patient and a relative.  Flank Pain This is a recurrent problem. The current episode started 12 to 24 hours ago. The problem occurs constantly. The problem has not changed since onset.Pertinent negatives include no chest pain, no abdominal pain, no headaches and no shortness of breath. The symptoms are aggravated by standing. The symptoms are relieved by rest. She has tried nothing for the symptoms.  Hematuria Pertinent negatives include no chest pain, no abdominal pain, no headaches and no shortness of breath. She has tried nothing for the symptoms.  Emesis Severity:  Moderate Associated symptoms: no abdominal pain, no chills, no fever, no headaches and no sore throat       Past Medical History:  Diagnosis Date   Medical history non-contributory    Renal disorder    UTI (urinary tract infection)     Patient Active Problem List    Diagnosis Date Noted   SVD (spontaneous vaginal delivery) 09/11/2012   Postpartum care following vaginal delivery (6/21) 09/11/2012    Past Surgical History:  Procedure Laterality Date   NO PAST SURGERIES       OB History     Gravida  1   Para  1   Term  1   Preterm      AB      Living  1      SAB      IAB      Ectopic      Multiple      Live Births  1           History reviewed. No pertinent family history.  Social History   Tobacco Use   Smoking status: Every Day    Packs/day: 1.00    Types: Cigars, Cigarettes   Smokeless tobacco: Never  Substance Use Topics   Alcohol use: No   Drug use: No    Home Medications Prior to Admission medications   Medication Sig Start Date End Date Taking? Authorizing Provider  etonogestrel (NEXPLANON) 68 MG IMPL implant 1 each by Subdermal route once.   Yes [provider]  ondansetron (ZOFRAN) 4 MG tablet Take 1 tablet (4 mg total) by mouth every 6 (six) hours for 5 days. 12/13/20 12/18/20 Yes Cato Liburd, Leonie Douglas, PA-C  oxyCODONE-acetaminophen (PERCOCET/ROXICET) 5-325 MG tablet Take 1 tablet by mouth  every 6 (six) hours as needed for up to 3 days for severe pain. 12/13/20 12/16/20 Yes Claude Manges, PA-C    Allergies    Patient has no known allergies.  Review of Systems   Review of Systems  Constitutional:  Negative for chills and fever.  HENT:  Negative for sore throat.   Respiratory:  Negative for shortness of breath.   Cardiovascular:  Negative for chest pain.  Gastrointestinal:  Positive for nausea and vomiting. Negative for abdominal pain.  Genitourinary:  Positive for flank pain and hematuria.  Musculoskeletal:  Negative for back pain.  Neurological:  Negative for headaches.  All other systems reviewed and are negative.  Physical Exam Updated Vital Signs BP (!) 144/104   Pulse 77   Temp 97.7 F (36.5 C) (Oral)   Resp 18   Ht 5\' 2"  (1.575 m)   Wt 68 kg   SpO2 98%   BMI 27.44 kg/m   Physical  Exam Vitals and nursing note reviewed.  Constitutional:      Appearance: Normal appearance.     Comments: Non toxic appearance  HENT:     Head: Normocephalic and atraumatic.     Nose: Nose normal.     Mouth/Throat:     Mouth: Mucous membranes are moist.  Eyes:     Pupils: Pupils are equal, round, and reactive to light.  Cardiovascular:     Rate and Rhythm: Normal rate.  Pulmonary:     Effort: Pulmonary effort is normal.     Breath sounds: No wheezing.  Abdominal:     General: Abdomen is flat.     Tenderness: There is no abdominal tenderness. There is right CVA tenderness. There is no left CVA tenderness or rebound.     Comments: Mild  right CVA tenderness   Musculoskeletal:     Cervical back: Normal range of motion and neck supple.  Skin:    General: Skin is warm and dry.  Neurological:     Mental Status: She is alert and oriented to person, place, and time.    ED Results / Procedures / Treatments   Labs (all labs ordered are listed, but only abnormal results are displayed) Labs Reviewed  CBC WITH DIFFERENTIAL/PLATELET - Abnormal; Notable for the following components:      Result Value   WBC 13.5 (*)    Neutro Abs 8.2 (*)    Lymphs Abs 4.1 (*)    All other components within normal limits  COMPREHENSIVE METABOLIC PANEL - Abnormal; Notable for the following components:   Potassium 3.0 (*)    Glucose, Bld 183 (*)    AST 14 (*)    All other components within normal limits  URINALYSIS, ROUTINE W REFLEX MICROSCOPIC - Abnormal; Notable for the following components:   APPearance CLOUDY (*)    Hgb urine dipstick LARGE (*)    Protein, ur 30 (*)    RBC / HPF >50 (*)    Bacteria, UA RARE (*)    All other components within normal limits  PREGNANCY, URINE    EKG None  Radiology CT Renal Stone Study  Result Date: 12/13/2020 CLINICAL DATA:  Acute right-sided flank pain. EXAM: CT ABDOMEN AND PELVIS WITHOUT CONTRAST TECHNIQUE: Multidetector CT imaging of the abdomen and  pelvis was performed following the standard protocol without IV contrast. COMPARISON:  September 02, 2018. FINDINGS: Lower chest: No acute abnormality. Hepatobiliary: No focal liver abnormality is seen. No gallstones, gallbladder wall thickening, or biliary dilatation. Pancreas: Unremarkable. No pancreatic  ductal dilatation or surrounding inflammatory changes. Spleen: Normal in size without focal abnormality. Adrenals/Urinary Tract: Adrenal glands appear normal. Bilateral nonobstructive nephrolithiasis is noted. Mild proximal right ureteral dilatation is noted without definite evidence of obstructive calculus. Small calculus is noted in dependent portion of urinary bladder. Stomach/Bowel: Stomach is within normal limits. Appendix appears normal. No evidence of bowel wall thickening, distention, or inflammatory changes. Vascular/Lymphatic: No significant vascular findings are present. No enlarged abdominal or pelvic lymph nodes. Reproductive: Uterus and bilateral adnexa are unremarkable. Other: No abdominal wall hernia or abnormality. No abdominopelvic ascites. Musculoskeletal: No acute or significant osseous findings. IMPRESSION: Bilateral nonobstructive nephrolithiasis. Mild proximal right ureteral dilatation is noted without definite evidence of ureteral calculus, although small calculus is noted in dependent portion of urinary bladder. Potentially this may represent recently passed stone. Electronically Signed   By: Lupita Raider M.D.   On: 12/13/2020 08:42    Procedures Procedures   Medications Ordered in ED Medications - No data to display  ED Course  I have reviewed the triage vital signs and the nursing notes.  Pertinent labs & imaging results that were available during my care of the patient were reviewed by me and considered in my medical decision making (see chart for details).  Clinical Course as of 12/13/20 0905  Thu Dec 13, 2020  5093 Bacteria, UA(!): RARE [JS]  2671 Hgb urine dipstick(!):  LARGE [JS]  0639 Creatinine: 0.73 [JS]    Clinical Course User Index [JS] Claude Manges, PA-C   MDM Rules/Calculators/A&P   Patient presents with right flank pain that is been ongoing since last night.  Prior history of kidney stones, does report similar symptoms in the past.  No medication trial prior to arrival.  Endorsing urinary symptoms such as hematuria.  Arrival in the ED with slightly elevated heart rate in the 111's, does report the right flank pain was so intense that she had a couple episodes of nonbilious, nonbloody emesis.  Last menstrual period is unknown as patient currently has a Nexplanon.  She denies any vaginal bleeding, no abdominal pain.  During evaluation patient is overall nontoxic, non-ill-appearing.  Vitals on reassessment with an improved heart rate as she is resting comfortably in room.  There is minimal CVA tenderness to the right side.  No pain along the left CVA.  Abdomen is soft nontender to palpation.  Lungs are clear to auscultation.  Moves all upper and lower extremities.  Labs have been obtained while she was in waiting room.  These are interpreted by me with a CBC with a slight leukocytosis of 13.5, suspect likely reactive due to vomiting.  CMP remarkable for a potassium of 3.0, will provide p.o. potassium once nausea has subsided.  Creatinine level is within normal limits and reassuring.  LFTs are within normal limits.  hCG is negative.  UA with large hemoglobin, white blood cell count is 0-5, however specimen not collected clean.  Suspicion for recent kidney stone or cyst pyelonephritis.  However, UA not particularly infected at this time.  Has been sent for culture.  We discussed symptomatic treatment with analgesia, antiemetics.  However, patient reports her symptoms have subsided significantly since arrival.  We did discuss obtaining a CT renal to further evaluate for any further stones.  She is agreeable of this at this time.  CT Renal showed: Bilateral  nonobstructive nephrolithiasis. Mild proximal right  ureteral dilatation is noted without definite evidence of ureteral  calculus, although small calculus is noted in dependent portion of  urinary bladder. Potentially this may represent recently passed  stone.      8:55 AM results of the CT were discussed with patient at length.  We did discuss preventatively sending her home on some strong pain medication along with Zofran in case of reoccurrence.  She will also follow-up with [facility] if her symptoms worsen.  Patient without any further episodes of vomiting at this time.  Without any pain, stable for discharge.   Portions of this note were generated with Scientist, clinical (histocompatibility and immunogenetics). Dictation errors may occur despite best attempts at proofreading.  Final Clinical Impression(s) / ED Diagnoses Final diagnoses:  Right flank pain  Nausea and vomiting, intractability of vomiting not specified, unspecified vomiting type    Rx / DC Orders ED Discharge Orders          Ordered    ondansetron (ZOFRAN) 4 MG tablet  Every 6 hours        12/13/20 0851    oxyCODONE-acetaminophen (PERCOCET/ROXICET) 5-325 MG tablet  Every 6 hours PRN        12/13/20 0851             Claude Manges, PA-C 12/13/20 0905    Glynn Octave, MD 12/13/20 [phone]

## 2020-12-13 NOTE — ED Triage Notes (Signed)
Pt complains of right sided flank pain, vomiting, and hematuria since last night. Hx of kidney stones.

## 2020-12-13 NOTE — ED Notes (Signed)
Patient transported to CT 

## 2020-12-13 NOTE — Discharge Instructions (Addendum)
We discussed the results of your CT renal at length.  We have opted for providing medication in case episodes recur.  You will be sent home with a prescription for Zofran to help with nausea.  Percocet to help with severe pain, please take this medication as prescribed.  Please do not drink alcohol or drive while taking this medication.  The phone number to [facility] is attached your chart, please schedule an appointment if your symptoms worsen.  If you experience any fever, worsening vomiting you will need to return to the emergency department.

## 2022-03-02 ENCOUNTER — Emergency Department (HOSPITAL_BASED_OUTPATIENT_CLINIC_OR_DEPARTMENT_OTHER)
Admission: EM | Admit: 2022-03-02 | Discharge: 2022-03-03 | Disposition: A | Discharge status: Home / Self Care | Payer: Self-pay | Attending: Emergency Medicine | Admitting: Emergency Medicine

## 2022-03-02 ENCOUNTER — Other Ambulatory Visit: Payer: Self-pay

## 2022-03-02 ENCOUNTER — Encounter (HOSPITAL_BASED_OUTPATIENT_CLINIC_OR_DEPARTMENT_OTHER): Payer: Self-pay | Admitting: Emergency Medicine

## 2022-03-02 DIAGNOSIS — R030 Elevated blood-pressure reading, without diagnosis of hypertension: Secondary | ICD-10-CM | POA: Insufficient documentation

## 2022-03-02 DIAGNOSIS — R519 Headache, unspecified: Secondary | ICD-10-CM

## 2022-03-02 DIAGNOSIS — Z79899 Other long term (current) drug therapy: Secondary | ICD-10-CM | POA: Insufficient documentation

## 2022-03-02 LAB — URINALYSIS, ROUTINE W REFLEX MICROSCOPIC
Bilirubin Urine: NEGATIVE
Glucose, UA: NEGATIVE mg/dL
Ketones, ur: NEGATIVE mg/dL
Leukocytes,Ua: NEGATIVE
Nitrite: NEGATIVE
Protein, ur: 30 mg/dL — AB
Specific Gravity, Urine: 1.02 (ref 1.005–1.030)
pH: 7 (ref 5.0–8.0)

## 2022-03-02 LAB — CBC WITH DIFFERENTIAL/PLATELET
Abs Immature Granulocytes: 0.03 10*3/uL (ref 0.00–0.07)
Basophils Absolute: 0 10*3/uL (ref 0.0–0.1)
Basophils Relative: 0 %
Eosinophils Absolute: 0.2 10*3/uL (ref 0.0–0.5)
Eosinophils Relative: 2 %
HCT: 41.2 % (ref 36.0–46.0)
Hemoglobin: 13.4 g/dL (ref 12.0–15.0)
Immature Granulocytes: 0 %
Lymphocytes Relative: 29 %
Lymphs Abs: 2.5 10*3/uL (ref 0.7–4.0)
MCH: 26.2 pg (ref 26.0–34.0)
MCHC: 32.5 g/dL (ref 30.0–36.0)
MCV: 80.5 fL (ref 80.0–100.0)
Monocytes Absolute: 0.7 10*3/uL (ref 0.1–1.0)
Monocytes Relative: 8 %
Neutro Abs: 5.2 10*3/uL (ref 1.7–7.7)
Neutrophils Relative %: 61 %
Platelets: 306 10*3/uL (ref 150–400)
RBC: 5.12 MIL/uL — ABNORMAL HIGH (ref 3.87–5.11)
RDW: 13.6 % (ref 11.5–15.5)
WBC: 8.6 10*3/uL (ref 4.0–10.5)
nRBC: 0 % (ref 0.0–0.2)

## 2022-03-02 LAB — BASIC METABOLIC PANEL
Anion gap: 8 (ref 5–15)
BUN: 9 mg/dL (ref 6–20)
CO2: 26 mmol/L (ref 22–32)
Calcium: 9.1 mg/dL (ref 8.9–10.3)
Chloride: 103 mmol/L (ref 98–111)
Creatinine, Ser: 0.74 mg/dL (ref 0.44–1.00)
GFR, Estimated: >60 mL/min (ref >60)
Glucose, Bld: 110 mg/dL — ABNORMAL HIGH (ref 70–99)
Potassium: 3.2 mmol/L — ABNORMAL LOW (ref 3.5–5.1)
Sodium: 137 mmol/L (ref 135–145)

## 2022-03-02 LAB — URINALYSIS, MICROSCOPIC (REFLEX)

## 2022-03-02 LAB — PREGNANCY, URINE: Preg Test, Ur: NEGATIVE

## 2022-03-02 MED ORDER — SODIUM CHLORIDE 0.9 % IV BOLUS
500.0000 mL | Freq: Once | INTRAVENOUS | Status: AC
Start: 2022-03-02 — End: 2022-03-02
  Administered 2022-03-02: 500 mL via INTRAVENOUS

## 2022-03-02 MED ORDER — AMLODIPINE BESYLATE 5 MG PO TABS
5.0000 mg | ORAL_TABLET | Freq: Once | ORAL | Status: AC
  Administered 2022-03-02: 5 mg via ORAL
  Filled 2022-03-02: qty 1

## 2022-03-02 MED ORDER — AMLODIPINE BESYLATE 5 MG PO TABS: 5.0000 mg | ORAL_TABLET | Freq: Every day | ORAL | 0 refills | Status: DC

## 2022-03-02 MED ORDER — KETOROLAC TROMETHAMINE 15 MG/ML IJ SOLN
15.0000 mg | Freq: Once | INTRAMUSCULAR | Status: AC
  Administered 2022-03-02: 15 mg via INTRAVENOUS
  Filled 2022-03-02: qty 1

## 2022-03-02 MED ORDER — DEXAMETHASONE SODIUM PHOSPHATE 10 MG/ML IJ SOLN
10.0000 mg | Freq: Once | INTRAMUSCULAR | Status: AC
  Administered 2022-03-02: 10 mg via INTRAVENOUS
  Filled 2022-03-02: qty 1

## 2022-03-02 NOTE — ED Triage Notes (Signed)
Pt reports HA x 3d; took BP at work and it was "204"; cannot see PC until insurance starts in Jan

## 2022-03-02 NOTE — Discharge Instructions (Addendum)
Monitor blood pressure, record readings, take log with you to PCP follow up. If you do not have a PCP, please follow up with referral provided, call to schedule an appointment.   If your blood pressure is staying higher than 140/90, start Norvasc as prescribed tonight.  Return to the ER for worsening or concerning symptoms.

## 2022-03-02 NOTE — ED Provider Notes (Signed)
[facility] EMERGENCY DEPARTMENT Provider Note   CSN: [id number] Arrival date & time: 03/02/22  2026     History {Add pertinent medical, surgical, social history, OB history to HPI:1} Chief Complaint  Patient presents with   Headache    Yvette Perez is a 31 y.o. female.  31 year old female presents with complaint of headache x 3 days with elevated blood pressure.  Patient states that her headache started when she was smoking Black and milds, states that she typically gets headaches when she smokes.  She has not smoked for the past 3 days but her headache persists.  Patient took 2 Tylenol yesterday without improvement.  Denies associated nausea, vomiting, changes in vision, speech, gait, unilateral weakness or numbness.  Does report photophobia.       Home Medications Prior to Admission medications   Medication Sig Start Date End Date Taking? Authorizing Provider  etonogestrel (NEXPLANON) 68 MG IMPL implant 1 each by Subdermal route once.    [provider]      Allergies    Patient has no known allergies.    Review of Systems   Review of Systems Negative except as per HPI Physical Exam Updated Vital Signs BP (!) 190/132   Pulse 93   Temp 98.4 F (36.9 C) (Oral)   Resp 18   Ht 5\' 2"  (1.575 m)   Wt 70.3 kg   SpO2 100%   BMI 28.35 kg/m  Physical Exam Vitals and nursing note reviewed.  Constitutional:      General: She is not in acute distress.    Appearance: She is well-developed. She is not diaphoretic.  HENT:     Head: Normocephalic and atraumatic.  Eyes:     Extraocular Movements: Extraocular movements intact.     Right eye: Normal extraocular motion.     Left eye: Normal extraocular motion.     Pupils: Pupils are equal, round, and reactive to light.  Cardiovascular:     Rate and Rhythm: Normal rate and regular rhythm.     Pulses: Normal pulses.     Heart sounds: Normal heart sounds.  Pulmonary:     Effort: Pulmonary effort is  normal.     Breath sounds: Normal breath sounds.  Abdominal:     Palpations: Abdomen is soft.  Musculoskeletal:     Cervical back: Neck supple.     Right lower leg: No edema.     Left lower leg: No edema.  Skin:    General: Skin is warm and dry.  Neurological:     Mental Status: She is alert and oriented to person, place, and time.     GCS: GCS eye subscore is 4. GCS verbal subscore is 5. GCS motor subscore is 6.     Cranial Nerves: No cranial nerve deficit or facial asymmetry.     Sensory: No sensory deficit.     Motor: No weakness.     Coordination: Coordination normal.     Gait: Gait normal.  Psychiatric:        Behavior: Behavior normal.     ED Results / Procedures / Treatments   Labs (all labs ordered are listed, but only abnormal results are displayed) Labs Reviewed  CBC WITH DIFFERENTIAL/PLATELET  BASIC METABOLIC PANEL  URINALYSIS, ROUTINE W REFLEX MICROSCOPIC  PREGNANCY, URINE    EKG None  Radiology No results found.  Procedures Procedures  {Document cardiac monitor, telemetry assessment procedure when appropriate:1}  Medications Ordered in ED Medications  sodium chloride 0.9 %  bolus 500 mL (has no administration in time range)    ED Course/ Medical Decision Making/ A&P                           Medical Decision Making Amount and/or Complexity of Data Reviewed Labs: ordered.   This patient presents to the ED for concern of ***, this involves an extensive number of treatment options, and is a complaint that carries with it a high risk of complications and morbidity.  The differential diagnosis includes ***   Co morbidities that complicate the patient evaluation  ***   Additional history obtained:  Additional history obtained from *** External records from outside source obtained and reviewed including ***   Lab Tests:  I Ordered, and personally interpreted labs.  The pertinent results include:  ***   Imaging Studies ordered:  I  ordered imaging studies including ***  I independently visualized and interpreted imaging which showed *** I agree with the radiologist interpretation   Cardiac Monitoring: / EKG:  The patient was maintained on a cardiac monitor.  I personally viewed and interpreted the cardiac monitored which showed an underlying rhythm of: ***   Consultations Obtained:  I requested consultation with the ***,  and discussed lab and imaging findings as well as pertinent plan - they recommend: ***   Problem List / ED Course / Critical interventions / Medication management  *** I ordered medication including ***  for ***  Reevaluation of the patient after these medicines showed that the patient {resolved/improved/worsened:23923::"improved"} BP elevated on arrival, no history of HTN.  I have reviewed the patients home medicines and have made adjustments as needed   Social Determinants of Health:  ***   Test / Admission - Considered:  ***   {Document critical care time when appropriate:1} {Document review of labs and clinical decision tools ie heart score, Chads2Vasc2 etc:1}  {Document your independent review of radiology images, and any outside records:1} {Document your discussion with family members, caretakers, and with consultants:1} {Document social determinants of health affecting pt's care:1} {Document your decision making why or why not admission, treatments were needed:1} Final Clinical Impression(s) / ED Diagnoses Final diagnoses:  None    Rx / DC Orders ED Discharge Orders     None

## 2022-03-03 MED ORDER — POTASSIUM CHLORIDE CRYS ER 20 MEQ PO TBCR
40.0000 meq | EXTENDED_RELEASE_TABLET | Freq: Once | ORAL | Status: AC
  Administered 2022-03-03: 40 meq via ORAL
  Filled 2022-03-03: qty 2

## 2022-03-28 ENCOUNTER — Telehealth (INDEPENDENT_AMBULATORY_CARE_PROVIDER_SITE_OTHER): Payer: Self-pay | Admitting: Primary Care

## 2022-03-28 NOTE — Telephone Encounter (Signed)
Attempted to reach patient through several numbers listed in her chart. Was not able to leave a message informing her of her visit needing to be virtual.

## 2022-03-31 ENCOUNTER — Ambulatory Visit: Payer: Self-pay

## 2022-03-31 ENCOUNTER — Telehealth (INDEPENDENT_AMBULATORY_CARE_PROVIDER_SITE_OTHER): Payer: Self-pay | Admitting: Primary Care

## 2022-03-31 NOTE — Telephone Encounter (Signed)
     Chief Complaint: High BP - 154/118 today. Appointment today changed by the practice. Will run out of medications before new appointment 04/11/22. Appointment made for this week to refill BP medication. Symptoms: Mild headache Frequency: December - seen in ED. Pertinent Negatives: Patient denies  Disposition: [] ED /[] Urgent Care (no appt availability in office) / [x] Appointment(In office/virtual)/ []  [facility]/ [] Home Care/ [] Refused Recommended Disposition /[] [place]/ []  Follow-up with PCP Additional Notes: Call back for worsening of symptoms. Declined appointment today.   Reason for Disposition  Systolic BP  >= 324 OR Diastolic >= 401  Answer Assessment - Initial Assessment Questions 1. BLOOD PRESSURE: "What is the blood pressure?" "Did you take at least two measurements 5 minutes apart?"     154/118 2. ONSET: "When did you take your blood pressure?"     Today 3. HOW: "How did you take your blood pressure?" (e.g., automatic home BP monitor, visiting nurse)     Home cuff 4. HISTORY: "Do you have a history of high blood pressure?"     Yes 5. MEDICINES: "Are you taking any medicines for blood pressure?" "Have you missed any doses recently?"     Amlodipine 6. OTHER SYMPTOMS: "Do you have any symptoms?" (e.g., blurred vision, chest pain, difficulty breathing, headache, weakness)     Headache - Excedrin 7. PREGNANCY: "Is there any chance you are pregnant?" "When was your last menstrual period?"     No  Protocols used: Blood Pressure - High-A-AH

## 2022-04-03 ENCOUNTER — Telehealth: Payer: Self-pay | Admitting: Physician Assistant

## 2022-04-03 ENCOUNTER — Ambulatory Visit: Payer: Self-pay | Admitting: Physician Assistant

## 2022-04-07 ENCOUNTER — Encounter: Payer: Self-pay | Admitting: Physician Assistant

## 2022-04-07 ENCOUNTER — Ambulatory Visit: Payer: Self-pay | Admitting: Physician Assistant

## 2022-04-07 VITALS — BP 159/116 | HR 92 | Ht 62.0 in | Wt 162.0 lb

## 2022-04-07 DIAGNOSIS — I10 Essential (primary) hypertension: Secondary | ICD-10-CM | POA: Insufficient documentation

## 2022-04-07 DIAGNOSIS — Z1159 Encounter for screening for other viral diseases: Secondary | ICD-10-CM

## 2022-04-07 DIAGNOSIS — E876 Hypokalemia: Secondary | ICD-10-CM

## 2022-04-07 MED ORDER — AMLODIPINE BESYLATE 10 MG PO TABS: 10.0000 mg | ORAL_TABLET | Freq: Every day | ORAL | 1 refills | Status: DC

## 2022-04-07 NOTE — Progress Notes (Signed)
New Patient Office Visit  Subjective    Patient ID: Yvette Perez, female    DOB: 02-Apr-1990  Age: 32 y.o. MRN: [id number]  CC:  Chief Complaint  Patient presents with   Hypertension    Las seen in ED, 12.10.23 for headaches and elvated bp, Pt had Hp FU scheduled 1.29.24 Needs medication refill. Pt has log on phone.      HPI Yvette Perez states that she was started on amlodipine after an emergency room visit a little over a month ago.  States that she has been taking her blood pressure on a daily basis since starting the medication, states that she did not notice any improvement in her blood pressure readings.  States that she has been out of the medication for the past week.  States that she has upcoming appt to establish care in two weeks.     Outpatient Encounter Medications as of 04/07/2022  Medication Sig   etonogestrel (NEXPLANON) 68 MG IMPL implant 1 each by Subdermal route once.   amLODipine (NORVASC) 10 MG tablet Take 1 tablet (10 mg total) by mouth daily.   [DISCONTINUED] amLODipine (NORVASC) 5 MG tablet Take 1 tablet (5 mg total) by mouth daily. (Patient not taking: Reported on 04/07/2022)   No facility-administered encounter medications on file as of 04/07/2022.    Past Medical History:  Diagnosis Date   Medical history non-contributory    Renal disorder    UTI (urinary tract infection)     Past Surgical History:  Procedure Laterality Date   NO PAST SURGERIES      History reviewed. No pertinent family history.  Social History   Socioeconomic History   Marital status: Single    Spouse name: Not on file   Number of children: Not on file   Years of education: Not on file   Highest education level: Not on file  Occupational History   Not on file  Tobacco Use   Smoking status: Every Day    Packs/day: 1.00    Types: Cigars, Cigarettes   Smokeless tobacco: Never  Substance and Sexual Activity   Alcohol use: No   Drug use: No   Sexual activity: Not on  file  Other Topics Concern   Not on file  Social History Narrative   Not on file   Social Determinants of Health   Financial Resource Strain: Not on file  Food Insecurity: Not on file  Transportation Needs: Not on file  Physical Activity: Not on file  Stress: Not on file  Social Connections: Not on file  Intimate Partner Violence: Not on file    Review of Systems  Constitutional: Negative.   HENT: Negative.    Eyes: Negative.   Respiratory:  Negative for shortness of breath.   Cardiovascular:  Negative for chest pain.  Gastrointestinal: Negative.   Genitourinary: Negative.   Musculoskeletal: Negative.   Skin: Negative.   Neurological: Negative.   Endo/Heme/Allergies: Negative.   Psychiatric/Behavioral: Negative.          Objective    BP (!) 159/116   Pulse 92   Ht 5\' 2"  (1.575 m)   Wt 162 lb (73.5 kg)   SpO2 98%   BMI 29.63 kg/m   Physical Exam Vitals and nursing note reviewed.  Constitutional:      Appearance: Normal appearance.  HENT:     Head: Normocephalic and atraumatic.     Right Ear: External ear normal.     Left Ear: External ear normal.  Nose: Nose normal.     Mouth/Throat:     Mouth: Mucous membranes are moist.     Pharynx: Oropharynx is clear.  Eyes:     Extraocular Movements: Extraocular movements intact.     Conjunctiva/sclera: Conjunctivae normal.     Pupils: Pupils are equal, round, and reactive to light.  Cardiovascular:     Rate and Rhythm: Normal rate and regular rhythm.     Pulses: Normal pulses.     Heart sounds: Normal heart sounds.  Pulmonary:     Effort: Pulmonary effort is normal.     Breath sounds: Normal breath sounds.  Musculoskeletal:        General: Normal range of motion.     Cervical back: Normal range of motion and neck supple.  Skin:    General: Skin is warm and dry.  Neurological:     General: No focal deficit present.     Mental Status: She is alert.  Psychiatric:        Mood and Affect: Mood normal.         Behavior: Behavior normal.        Thought Content: Thought content normal.        Judgment: Judgment normal.         Assessment & Plan:   Problem List Items Addressed This Visit       Cardiovascular and Mediastinum   Essential hypertension - Primary   Relevant Medications   amLODipine (NORVASC) 10 MG tablet   Other Relevant Orders   TSH   Other Visit Diagnoses     Hypokalemia       Relevant Orders   Basic Metabolic Panel   Encounter for HCV screening test for low risk patient       Relevant Orders   HCV Ab w Reflex to Quant PCR      1. Essential hypertension Increase amlodipine 10 mg.  Patient encouraged to continue checking blood pressure at home, keeping a written log and having available for all office visits.  Red flags given for prompt reevaluation.  Patient encouraged to continue follow-up to establish care. Return to mobile unit as needed.  - amLODipine (NORVASC) 10 MG tablet; Take 1 tablet (10 mg total) by mouth daily.  Dispense: 30 tablet; Refill: 1 - TSH  2. Hypokalemia  - Basic Metabolic Panel  3. Encounter for HCV screening test for low risk patient  - HCV Ab w Reflex to Quant PCR   I have reviewed the patient's medical history (PMH, PSH, Social History, Family History, Medications, and allergies) , and have been updated if relevant. I spent 30 minutes reviewing chart and  face to face time with patient.    Return if symptoms worsen or fail to improve.   Loraine Grip Mayers, PA-C

## 2022-04-07 NOTE — Patient Instructions (Signed)
You are going to take an increased dose of amlodipine 10 mg once daily.  I encourage you to continue checking your blood pressure at home, keeping a written log and having available for all office visits.  Great job on doing that.  I encourage you to continue staying well-hydrated.  I encourage you to work on improving your sleep, you should be sleeping 7 to 8 hours a night.  I encourage you to monitor your sodium intake, you should aim for 1500 to 2000 mg of sodium a day.  We will call you with today's lab results.  Kennieth Rad, PA-C Physician Assistant [facility] [email or web address]   Low-Sodium Eating Plan Sodium, which is an element that makes up salt, helps you maintain a healthy balance of fluids in your body. Too much sodium can increase your blood pressure and cause fluid and waste to be held in your body. Your health care provider or dietitian may recommend following this plan if you have high blood pressure (hypertension), kidney disease, liver disease, or heart failure. Eating less sodium can help lower your blood pressure, reduce swelling, and protect your heart, liver, and kidneys. What are tips for following this plan? Reading food labels The Nutrition Facts label lists the amount of sodium in one serving of the food. If you eat more than one serving, you must multiply the listed amount of sodium by the number of servings. Choose foods with less than 140 mg of sodium per serving. Avoid foods with 300 mg of sodium or more per serving. Shopping  Look for lower-sodium products, often labeled as "low-sodium" or "no salt added." Always check the sodium content, even if foods are labeled as "unsalted" or "no salt added." Buy fresh foods. Avoid canned foods and pre-made or frozen meals. Avoid canned, cured, or processed meats. Buy breads that have less than 80 mg of sodium per slice. Cooking  Eat more home-cooked food  and less restaurant, buffet, and fast food. Avoid adding salt when cooking. Use salt-free seasonings or herbs instead of table salt or sea salt. Check with your health care provider or pharmacist before using salt substitutes. Cook with plant-based oils, such as canola, sunflower, or olive oil. Meal planning When eating at a restaurant, ask that your food be prepared with less salt or no salt, if possible. Avoid dishes labeled as brined, pickled, cured, smoked, or made with soy sauce, miso, or teriyaki sauce. Avoid foods that contain MSG (monosodium glutamate). MSG is sometimes added to [place] food, bouillon, and some canned foods. Make meals that can be grilled, baked, poached, roasted, or steamed. These are generally made with less sodium. General information Most people on this plan should limit their sodium intake to 1,500-2,000 mg (milligrams) of sodium each day. What foods should I eat? Fruits Fresh, frozen, or canned fruit. Fruit juice. Vegetables Fresh or frozen vegetables. "No salt added" canned vegetables. "No salt added" tomato sauce and paste. Low-sodium or reduced-sodium tomato and vegetable juice. Grains Low-sodium cereals, including oats, puffed wheat and rice, and shredded wheat. Low-sodium crackers. Unsalted rice. Unsalted pasta. Low-sodium bread. Whole-grain breads and whole-grain pasta. Meats and other proteins Fresh or frozen (no salt added) meat, poultry, seafood, and fish. Low-sodium canned tuna and salmon. Unsalted nuts. Dried peas, beans, and lentils without added salt. Unsalted canned beans. Eggs. Unsalted nut butters. Dairy Milk. Soy milk. Cheese that is naturally low in sodium, such as ricotta cheese, fresh mozzarella, or Swiss cheese. Low-sodium or reduced-sodium cheese. Cream cheese.  Yogurt. Seasonings and condiments Fresh and dried herbs and spices. Salt-free seasonings. Low-sodium mustard and ketchup. Sodium-free salad dressing. Sodium-free light mayonnaise. Fresh  or refrigerated horseradish. Lemon juice. Vinegar. Other foods Homemade, reduced-sodium, or low-sodium soups. Unsalted popcorn and pretzels. Low-salt or salt-free chips. The items listed above may not be a complete list of foods and beverages you can eat. Contact a dietitian for more information. What foods should I avoid? Vegetables Sauerkraut, pickled vegetables, and relishes. Olives. [place] fries. Onion rings. Regular canned vegetables (not low-sodium or reduced-sodium). Regular canned tomato sauce and paste (not low-sodium or reduced-sodium). Regular tomato and vegetable juice (not low-sodium or reduced-sodium). Frozen vegetables in sauces. Grains Instant hot cereals. Bread stuffing, pancake, and biscuit mixes. Croutons. Seasoned rice or pasta mixes. Noodle soup cups. Boxed or frozen macaroni and cheese. Regular salted crackers. Self-rising flour. Meats and other proteins Meat or fish that is salted, canned, smoked, spiced, or pickled. Precooked or cured meat, such as sausages or meat loaves. Berniece Salines. Ham. Pepperoni. Hot dogs. Corned beef. Chipped beef. Salt pork. Jerky. Pickled herring. Anchovies and sardines. Regular canned tuna. Salted nuts. Dairy Processed cheese and cheese spreads. Hard cheeses. Cheese curds. Blue cheese. Feta cheese. String cheese. Regular cottage cheese. Buttermilk. Canned milk. Fats and oils Salted butter. Regular margarine. Ghee. Bacon fat. Seasonings and condiments Onion salt, garlic salt, seasoned salt, table salt, and sea salt. Canned and packaged gravies. Worcestershire sauce. Tartar sauce. Barbecue sauce. Teriyaki sauce. Soy sauce, including reduced-sodium. Steak sauce. Fish sauce. Oyster sauce. Cocktail sauce. Horseradish that you find on the shelf. Regular ketchup and mustard. Meat flavorings and tenderizers. Bouillon cubes. Hot sauce. Pre-made or packaged marinades. Pre-made or packaged taco seasonings. Relishes. Regular salad dressings. Salsa. Other foods Salted  popcorn and pretzels. Corn chips and puffs. Potato and tortilla chips. Canned or dried soups. Pizza. Frozen entrees and pot pies. The items listed above may not be a complete list of foods and beverages you should avoid. Contact a dietitian for more information. Summary Eating less sodium can help lower your blood pressure, reduce swelling, and protect your heart, liver, and kidneys. Most people on this plan should limit their sodium intake to 1,500-2,000 mg (milligrams) of sodium each day. Canned, boxed, and frozen foods are high in sodium. Restaurant foods, fast foods, and pizza are also very high in sodium. You also get sodium by adding salt to food. Try to cook at home, eat more fresh fruits and vegetables, and eat less fast food and canned, processed, or prepared foods. This information is not intended to replace advice given to you by your health care provider. Make sure you discuss any questions you have with your health care provider. Document Revised: 04/15/2019 Document Reviewed: 02/09/2019 Elsevier Patient Education  [employer].

## 2022-04-08 LAB — BASIC METABOLIC PANEL
BUN/Creatinine Ratio: 12 (ref 9–23)
BUN: 9 mg/dL (ref 6–20)
CO2: 23 mmol/L (ref 20–29)
Calcium: 10.5 mg/dL — ABNORMAL HIGH (ref 8.7–10.2)
Chloride: 99 mmol/L (ref 96–106)
Creatinine, Ser: 0.76 mg/dL (ref 0.57–1.00)
Glucose: 303 mg/dL — ABNORMAL HIGH (ref 70–99)
Potassium: 3.9 mmol/L (ref 3.5–5.2)
Sodium: 137 mmol/L (ref 134–144)
eGFR: 109 mL/min/{1.73_m2} (ref >59)

## 2022-04-08 LAB — HCV AB W REFLEX TO QUANT PCR: HCV Ab: NONREACTIVE

## 2022-04-08 LAB — HCV INTERPRETATION

## 2022-04-08 LAB — TSH: TSH: 1.83 u[IU]/mL (ref 0.450–4.500)

## 2022-04-11 ENCOUNTER — Inpatient Hospital Stay (INDEPENDENT_AMBULATORY_CARE_PROVIDER_SITE_OTHER): Payer: Self-pay | Admitting: Primary Care

## 2022-04-15 ENCOUNTER — Ambulatory Visit: Payer: Self-pay | Admitting: Physician Assistant

## 2022-04-15 ENCOUNTER — Encounter: Payer: Self-pay | Admitting: Physician Assistant

## 2022-04-15 VITALS — BP 142/98 | HR 94 | Ht 66.0 in | Wt 154.0 lb

## 2022-04-15 DIAGNOSIS — Z13220 Encounter for screening for lipoid disorders: Secondary | ICD-10-CM

## 2022-04-15 DIAGNOSIS — R7309 Other abnormal glucose: Secondary | ICD-10-CM

## 2022-04-15 DIAGNOSIS — E119 Type 2 diabetes mellitus without complications: Secondary | ICD-10-CM

## 2022-04-15 DIAGNOSIS — Z6824 Body mass index (BMI) 24.0-24.9, adult: Secondary | ICD-10-CM

## 2022-04-15 DIAGNOSIS — E782 Mixed hyperlipidemia: Secondary | ICD-10-CM

## 2022-04-15 DIAGNOSIS — I10 Essential (primary) hypertension: Secondary | ICD-10-CM

## 2022-04-15 DIAGNOSIS — F17290 Nicotine dependence, other tobacco product, uncomplicated: Secondary | ICD-10-CM

## 2022-04-15 DIAGNOSIS — R739 Hyperglycemia, unspecified: Secondary | ICD-10-CM

## 2022-04-15 LAB — POCT GLYCOSYLATED HEMOGLOBIN (HGB A1C): Hemoglobin A1C: 8.5 % — AB (ref 4.0–5.6)

## 2022-04-15 MED ORDER — ACCU-CHEK AVIVA PLUS W/DEVICE KIT: 1.0000 [IU] | PACK | Freq: Once | 0 refills | Status: AC

## 2022-04-15 MED ORDER — ACCU-CHEK SOFTCLIX LANCETS MISC
12 refills | Status: AC
Start: 2022-04-15 — End: ?

## 2022-04-15 MED ORDER — METFORMIN HCL ER 500 MG PO TB24
500.0000 mg | ORAL_TABLET | Freq: Every day | ORAL | 1 refills | Status: DC
Start: 2022-04-15 — End: 2022-06-02

## 2022-04-15 MED ORDER — ACCU-CHEK GUIDE VI STRP: 1.0000 | ORAL_STRIP | Freq: Two times a day (BID) | 12 refills | Status: AC

## 2022-04-15 NOTE — Progress Notes (Unsigned)
   Established Patient Office Visit  Subjective   Patient ID: Yvette Perez, female    DOB: 07/08/1990  Age: 32 y.o. MRN: [id number]  No chief complaint on file.   No previous   Water - sodas and juice  2-3 glass of juice ; 1 soda     Past Medical History:  Diagnosis Date   Medical history non-contributory    Renal disorder    UTI (urinary tract infection)    Social History   Socioeconomic History   Marital status: Single    Spouse name: Not on file   Number of children: Not on file   Years of education: Not on file   Highest education level: Not on file  Occupational History   Not on file  Tobacco Use   Smoking status: Every Day    Packs/day: 1.00    Types: Cigars, Cigarettes   Smokeless tobacco: Never  Substance and Sexual Activity   Alcohol use: No   Drug use: No   Sexual activity: Not on file  Other Topics Concern   Not on file  Social History Narrative   Not on file   Social Determinants of Health   Financial Resource Strain: Not on file  Food Insecurity: Not on file  Transportation Needs: Not on file  Physical Activity: Not on file  Stress: Not on file  Social Connections: Not on file  Intimate Partner Violence: Not on file   No family history on file. No Known Allergies    ROS    Objective:     There were no vitals taken for this visit.   Physical Exam     Assessment & Plan:   Problem List Items Addressed This Visit   None Visit Diagnoses     Elevated random blood glucose level    -  Primary   Relevant Orders   POCT glycosylated hemoglobin (Hb A1C)       No follow-ups on file.    Loraine Grip Mayers, PA-C

## 2022-04-15 NOTE — Patient Instructions (Signed)
Your A1c is 8.5.  You are going to start taking metformin once daily.  I encourage you to check your blood sugar levels once daily in the morning, keep a written log and have available for all office visits.  I encourage you to follow a low sugar diet.  Kennieth Rad, PA-C Physician Assistant [facility] [email or web address]  Diabetes Mellitus Basics  Diabetes mellitus, or diabetes, is a long-term (chronic) disease. It occurs when the body does not properly use sugar (glucose) that is released from food after you eat. Diabetes mellitus may be caused by one or both of these problems: Your pancreas does not make enough of a hormone called insulin. Your body does not react in a normal way to the insulin that it makes. Insulin lets glucose enter cells in your body. This gives you energy. If you have diabetes, glucose cannot get into cells. This causes high blood glucose (hyperglycemia). How to treat and manage diabetes You may need to take insulin or other diabetes medicines daily to keep your glucose in balance. If you are prescribed insulin, you will learn how to give yourself insulin by injection. You may need to adjust the amount of insulin you take based on the foods that you eat. You will need to check your blood glucose levels using a glucose monitor as told by your health care provider. The readings can help determine if you have low or high blood glucose. Generally, you should have these blood glucose levels: Before meals (preprandial): 80-130 mg/dL (4.4-7.2 mmol/L). After meals (postprandial): below 180 mg/dL (10 mmol/L). Hemoglobin A1c (HbA1c) level: less than 7%. Your health care provider will set treatment goals for you. Keep all follow-up visits. This is important. Managing blood glucose  Check your blood glucose levels using a glucose monitor as told by your health care provider. Write down the times that you check your  glucose levels here: Time: _______________ Notes: ___________________________________ Time: _______________ Notes: ___________________________________ Time: _______________ Notes: ___________________________________ Time: _______________ Notes: ___________________________________ Time: _______________ Notes: ___________________________________ Time: _______________ Notes: ___________________________________  Low blood glucose Low blood glucose (hypoglycemia) is when glucose is at or below 70 mg/dL (3.9 mmol/L). Symptoms may include: Feeling: Hungry. Sweaty and clammy. Irritable or easily upset. Dizzy. Sleepy. Having: A fast heartbeat. A headache. A change in your vision. Numbness around the mouth, lips, or tongue. Having trouble with: Moving (coordination). Sleeping. Treating low blood glucose To treat low blood glucose, eat or drink something containing sugar right away. If you can think clearly and swallow safely, follow the 15:15 rule: Take 15 grams of a fast-acting carb (carbohydrate), as told by your health care provider. Some fast-acting carbs are: Glucose tablets: take 3-4 tablets. Hard candy: eat 3-5 pieces. Fruit juice: drink 4 oz (120 mL). Regular (not diet) soda: drink 4-6 oz (120-180 mL). Honey or sugar: eat 1 Tbsp (15 mL). Check your blood glucose levels 15 minutes after you take the carb. If your glucose is still at or below 70 mg/dL (3.9 mmol/L), take 15 grams of a carb again. If your glucose does not go above 70 mg/dL (3.9 mmol/L) after 3 tries, get help right away. After your glucose goes back to normal, eat a meal or a snack within 1 hour. Treating very low blood glucose If your glucose is at or below 54 mg/dL (3 mmol/L), you have very low blood glucose (severe hypoglycemia). This is an emergency. Do not wait to see if the symptoms will go away. Get medical help right away.  Call your local emergency services (911 in the [place] Do not drive yourself to the  hospital. Questions to ask your health care provider Should I talk with a diabetes educator? What equipment will I need to care for myself at home? What diabetes medicines do I need? When should I take them? How often do I need to check my blood glucose levels? What number can I call if I have questions? When is my follow-up visit? Where can I find a support group for people with diabetes? Where to find more information American Diabetes Association: www.diabetes.org Association of Diabetes Care and Education Specialists: www.diabeteseducator.org Contact a health care provider if: Your blood glucose is at or above 240 mg/dL (13.3 mmol/L) for 2 days in a row. You have been sick or have had a fever for 2 days or more, and you are not getting better. You have any of these problems for more than 6 hours: You cannot eat or drink. You feel nauseous. You vomit. You have diarrhea. Get help right away if: Your blood glucose is lower than 54 mg/dL (3 mmol/L). You get confused. You have trouble thinking clearly. You have trouble breathing. These symptoms may represent a serious problem that is an emergency. Do not wait to see if the symptoms will go away. Get medical help right away. Call your local emergency services (911 in the [place] Do not drive yourself to the hospital. Summary Diabetes mellitus is a chronic disease that occurs when the body does not properly use sugar (glucose) that is released from food after you eat. Take insulin and diabetes medicines as told. Check your blood glucose every day, as often as told. Keep all follow-up visits. This is important. This information is not intended to replace advice given to you by your health care provider. Make sure you discuss any questions you have with your health care provider. Document Revised: 07/12/2019 Document Reviewed: 07/12/2019 Elsevier Patient Education  [place].

## 2022-04-16 DIAGNOSIS — E119 Type 2 diabetes mellitus without complications: Secondary | ICD-10-CM | POA: Insufficient documentation

## 2022-04-16 LAB — MICROALBUMIN / CREATININE URINE RATIO
Creatinine, Urine: 124.8 mg/dL
Microalb/Creat Ratio: 55 mg/g creat — ABNORMAL HIGH (ref 0–29)
Microalbumin, Urine: 69.1 ug/mL

## 2022-04-16 LAB — LIPID PANEL
Chol/HDL Ratio: 5 ratio — ABNORMAL HIGH (ref 0.0–4.4)
Cholesterol, Total: 206 mg/dL — ABNORMAL HIGH (ref 100–199)
HDL: 41 mg/dL (ref >39)
LDL Chol Calc (NIH): 145 mg/dL — ABNORMAL HIGH (ref 0–99)
Triglycerides: 112 mg/dL (ref 0–149)
VLDL Cholesterol Cal: 20 mg/dL (ref 5–40)

## 2022-04-17 MED ORDER — ATORVASTATIN CALCIUM 10 MG PO TABS: 10.0000 mg | ORAL_TABLET | Freq: Every day | ORAL | 1 refills | Status: DC

## 2022-04-17 NOTE — Addendum Note (Signed)
Addended by: Kennieth Rad on: 04/17/2022 10:08 AM   Modules accepted: Orders

## 2022-04-21 ENCOUNTER — Inpatient Hospital Stay (INDEPENDENT_AMBULATORY_CARE_PROVIDER_SITE_OTHER): Payer: Self-pay | Admitting: Primary Care

## 2022-04-25 ENCOUNTER — Ambulatory Visit: Payer: Self-pay | Admitting: *Deleted

## 2022-04-25 NOTE — Telephone Encounter (Signed)
  Chief Complaint: Constipation since starting new medication for cholesterol Lipitor 10 mg  Symptoms: Not had a BM for several days could not remember how many days.  Having some pain in her abd. Frequency: No BM  Pertinent Negatives: Patient denies vomiting Disposition: [] ED /[] Urgent Care (no appt availability in office) / [] Appointment(In office/virtual)/ []  [facility]/ [x] Home Care/ [] Refused Recommended Disposition /[] [facility]/ []  Follow-up with PCP Additional Notes: I went over the Care Advice with her.   She is going to try the Miralax and Dulcolax first.   I encouraged her to drink a lot of water too.    If she develops severe abd pain or still can't have a BM after trying the Dulcolax and Miralax to go to the ED.   She was agreeable to this plan.  She also gave me her glucose readings for [facility], PA-C.  She was started on metformin recently. 04/18/2022 glucose 175; 1/27 glucose 142; 1/28 glucose 121; 1/29 97;  1/30 152;   1/31 128;   2/1 128   2/2 163 this was an afternoon reading.   The others were morning readings.

## 2022-04-25 NOTE — Telephone Encounter (Signed)
:07 PM EST  Summary: Pt has not had a bowel movement since being prescribed a new medication.   Pt reports that she has not had a bowel movement since being prescribed a new medication. Pt also stated she was prescribed metformin but she has been checking her sugar and it is not reading as being high. Pt requests call back to discuss. Cb# [phone]          Call History   Type Contact Phone/Fax User  04/25/2022 03:04 PM EST Phone ([address] [place], [place] (Self) [phone] Jerilynn Mages) Yvette Rack   Reason for Disposition  Over-The-Counter (OTC) medicines for constipation, questions about  Answer Assessment - Initial Assessment Questions 1. STOOL PATTERN OR FREQUENCY: "How often do you have a bowel movement (BM)?"  (Normal range: 3 times a day to every 3 days)  "When was your last BM?"       I was started on Metformin.   My sugars are doing fine.    I'm having constipation.    I was started on cholesterol medicine when I went to the mobile unit.   It was rosuvastatin.    2. STRAINING: "Do you have to strain to have a BM?"      Yes I'm drinking a lot of water to help.   I tried a laxative yesterday and it did not work.    3. RECTAL PAIN: "Does your rectum hurt when the stool comes out?" If Yes, ask: "Do you have hemorrhoids? How bad is the pain?"  (Scale 1-10; or mild, moderate, severe)     I'm having pain in my stomach.   I feel like I need to go to the bathroom but I can't go.    4. STOOL COMPOSITION: "Are the stools hard?"      I haven't been in about a week.      5. BLOOD ON STOOLS: "Has there been any blood on the toilet tissue or on the surface of the BM?" If Yes, ask: "When was the last time?"     No 6. CHRONIC CONSTIPATION: "Is this a new problem for you?"  If No, ask: "How long have you had this problem?" (days, weeks, months)      New since starting the cholesterol medicine 7. CHANGES IN DIET OR HYDRATION: "Have there been any recent changes in your diet?" "How much fluids are  you drinking on a daily basis?"  "How much have you had to drink today?"     "I'm drinking a lot of water to help with the constipation" 8. MEDICINES: "Have you been taking any new medicines?" "Are you taking any narcotic pain medicines?" (e.g., Dilaudid, morphine, Percocet, Vicodin)     Yes the rosuvastatin and metformin. 9. LAXATIVES: "Have you been using any stool softeners, laxatives, or enemas?"  If Yes, ask "What, how often, and when was the last time?"     I tried a laxative yesterday but it did not help yet.  I was thinking about using Miralax.    10. ACTIVITY:  "How much walking do you do every day?"  "Has your activity level decreased in the past week?"        Not asked 11. CAUSE: "What do you think is causing the constipation?"        This new medicine 12. OTHER SYMPTOMS: "Do you have any other symptoms?" (e.g., abdomen pain, bloating, fever, vomiting)       I'm having some pain in my side since I haven't been  to the bathroom. 13. MEDICAL HISTORY: "Do you have a history of hemorrhoids, rectal fissures, or rectal surgery or rectal abscess?"         Not asked 14. PREGNANCY: "Is there any chance you are pregnant?" "When was your last menstrual period?"       Not asked  Protocols used: Constipation-A-AH

## 2022-04-28 ENCOUNTER — Ambulatory Visit: Payer: Self-pay | Admitting: *Deleted

## 2022-04-28 NOTE — Telephone Encounter (Signed)
Summary: med ?'s   Patient called about if she should keep taking the Metformin , since her BS is down  and also states the lipitor makes her constipated. She called on Friday and said she never got a response back about this.     Patient reports she has had a BM- she has been using the green tea and has been going- she is also still taking cholesterol medication.  Patient has been checking glucose- level- 1/26-175,1/27 142, 1/28 121, 1/29 97,1/30 152, 1/31 128, 2/1 128, 2/2 163 ,2/3 135, 2/4 182 Patient is checking fasting.  Patient advised to continue Metformin - fasting levels are not too low- continue to watch diet and keep appointment with nutrition.    Reason for Disposition  Caller has medicine question only, adult not sick, AND triager answers question  Answer Assessment - Initial Assessment Questions 1. NAME of MEDICINE: "What medicine(s) are you calling about?"     Metformin  2. QUESTION: "What is your question?" (e.g., double dose of medicine, side effect)     Should she continue? 3. PRESCRIBER: "Who prescribed the medicine?" Reason: if prescribed by specialist, call should be referred to that group.     Mobile unit provider  Patient advised to continue her medication- keep upcoming appointments.  Protocols used: Medication Question Call-A-AH

## 2022-05-08 ENCOUNTER — Ambulatory Visit: Payer: Self-pay | Admitting: *Deleted

## 2022-05-08 NOTE — Telephone Encounter (Signed)
Patient had questions for provider about checking her blood sugar. I told her we could get her scheduled for Monday, but she said she would just keep the appointment.

## 2022-05-08 NOTE — Telephone Encounter (Signed)
Summary: blood sugar fluctuating/Requesting patch instead of pricking herself everyday.   Pt stated instead of her pricking herself every day, she stated she wants to see if she can get the patch. Pt asking if she is supposed to be checking her sugar once a day or twice a day. Stated her blood sugar has been fluctuating.  Blood sugar numbers - Feb 13 - 212 / Feb 15- 160 has not checked today.  Pt has an upcoming appointment 02/28 to establish care with [facility].  Seeking clinical advice.     Patient requesting if  PCP cna order alternative for checking blood glucose other than finger sticks. Patient has not established with provider at this time. Requesting if earlier new patient appt can be given prior to 07/15/22. Please  advise if so. Recommended patient to go to UC/ED if issues arise regarding blood sugars or call [facility] for recommendations.        Reason for Disposition  Blood glucose 70-240 mg/dL (3.9 -13.3 mmol/L)  Answer Assessment - Initial Assessment Questions 1. BLOOD GLUCOSE: "What is your blood glucose level?"      Has been fluctuating and as high as 212 before  2. ONSET: "When did you check the blood glucose?"     na 3. USUAL RANGE: "What is your glucose level usually?" (e.g., usual fasting morning value, usual evening value)     na 4. KETONES: "Do you check for ketones (urine or blood test strips)?" If Yes, ask: "What does the test show now?"      na 5. TYPE 1 or 2:  "Do you know what type of diabetes you have?"  (e.g., Type 1, Type 2, Gestational; doesn't know)      na 6. INSULIN: "Do you take insulin?" "What type of insulin(s) do you use? What is the mode of delivery? (syringe, pen; injection or pump)?"      no 7. DIABETES PILLS: "Do you take any pills for your diabetes?" If Yes, ask: "Have you missed taking any pills recently?"     Taking metformin  8. OTHER SYMPTOMS: "Do you have any symptoms?" (e.g., fever, frequent urination, difficulty breathing, dizziness,  weakness, vomiting)     No sx reported  9. PREGNANCY: "Is there any chance you are pregnant?" "When was your last menstrual period?"     na  Protocols used: Diabetes - High Blood Sugar-A-AH

## 2022-05-21 ENCOUNTER — Encounter: Payer: Self-pay | Admitting: Registered"

## 2022-05-21 ENCOUNTER — Encounter: Payer: 59 | Attending: Physician Assistant | Admitting: Registered"

## 2022-05-21 DIAGNOSIS — E119 Type 2 diabetes mellitus without complications: Secondary | ICD-10-CM | POA: Insufficient documentation

## 2022-05-21 NOTE — Patient Instructions (Signed)
Call insurance to see which meter is covered and what the co-pay is. Call MD office to update meter Rx if needed, and update Rx instructions to checking at least 2x/day Plan to eat 3 balanced meals per day and balanced snacks if next meal is more than 3 hours away. Dinner by 7 pm, snacks later okay. Exercise: 3-5 x/week combination of aerobic and resistance exercise

## 2022-05-21 NOTE — Progress Notes (Signed)
Diabetes Self-Management Education  Visit Type: First/Initial  Appt. Start Time: 0910 Appt. End Time: 1010  05/21/2022  [name], identified by name and date of birth, is a 32 y.o. female with a diagnosis of Diabetes: Type 2.   ASSESSMENT  There were no vitals taken for this visit. There is no height or weight on file to calculate BMI.  Lab Results  Component Value Date   HGBA1C 8.5 (A) 04/15/2022  Medication: metformin 500 mg  Pt states prior to T2D diagnosis last month she was not taking any medication. Pt states she stopped taking statin because it made her constipated. Pt states she doesn't like having so many pills to take now.  Pt states she is only checking blood sugar 1x/day due to not given enough supplies to check 2x/day and cost. Pt was only checking at bedtime. Pt has been keeping a log in her phone.  Pt states she has made changes since getting diagnosed including cut back bread, potatoes and sweets, drinking 1/2 sweet/unsweet tea.  Pt states she was walking daily in the summer. Has been doing some jump roping/jumping jacks recently, especially when she feels like she ate too much.  Pt states she has been discouraged when trying to find healthier options to some of her foods such as cauliflower chips in place of potato chips.   Pt states she is not a picky eater and likes almost all vegetables. Pt states she has switched to an organic seed bread.    Diabetes Self-Management Education - 05/21/22 1000       Visit Information   Visit Type First/Initial      Initial Visit   Diabetes Type Type 2    Date Diagnosed 05/19/22    Are you currently following a meal plan? No    Are you taking your medications as prescribed? No      Health Coping   How would you rate your overall health? Fair      Psychosocial Assessment   Patient Belief/Attitude about Diabetes Defeat/Burnout    What is the hardest part about your diabetes right now, causing you the most  concern, or is the most worrisome to you about your diabetes?   Making healty food and beverage choices    How often do you need to have someone help you when you read instructions, pamphlets, or other written materials from your doctor or pharmacy? 1 - Never    What is the last grade level you completed in school? college      Complications   Last HgB A1C per patient/outside source 8.5 %    How often do you check your blood sugar? 1-2 times/day    Have you had a dilated eye exam in the past 12 months? Yes    Have you had a dental exam in the past 12 months? No    Are you checking your feet? Yes    How many days per week are you checking your feet? 1      Dietary Intake   Lunch chicken wrap, small fries ([employer])    Snack (afternoon) ritz crackers and cheese snack pack    Dinner BBQ pork    Snack (evening) fried pickles    Beverage(s) water, 1/2 sweet tea      Activity / Exercise   Activity / Exercise Type ADL's      Patient Education   Previous Diabetes Education No    Disease Pathophysiology Definition of diabetes, type 1  and 2, and the diagnosis of diabetes;Other (comment)    Healthy Eating Reviewed blood glucose goals for pre and post meals and how to evaluate the patients' food intake on their blood glucose level.    Being Active Role of exercise on diabetes management, blood pressure control and cardiac health.    Medications Reviewed patients medication for diabetes, action, purpose, timing of dose and side effects.    Monitoring Taught/discussed recording of test results and interpretation of SMBG.      Individualized Goals (developed by patient)   Nutrition General guidelines for healthy choices and portions discussed    Physical Activity Exercise 3-5 times per week    Medications Other (comment)   contact insurance and MD regarding meter supplies   Monitoring  Test my blood glucose as discussed      Outcomes   Expected Outcomes Demonstrated interest in learning.  Expect positive outcomes    Future DMSE PRN;4-6 wks    Program Status Not Completed            Individualized Plan for Diabetes Self-Management Training:   Learning Objective:  Patient will have a greater understanding of diabetes self-management. Patient education plan is to attend individual and/or group sessions per assessed needs and concerns.  Patient Instructions  Call insurance to see which meter is covered and what the co-pay is. Call MD office to update meter Rx if needed, and update Rx instructions to checking at least 2x/day Plan to eat 3 balanced meals per day and balanced snacks if next meal is more than 3 hours away. Dinner by 7 pm, snacks later okay. Exercise: 3-5 x/week combination of aerobic and resistance exercise  Expected Outcomes:  Demonstrated interest in learning. Expect positive outcomes  Education material provided: A1C conversion sheet and My Plate  If problems or questions, patient to contact team via:  Phone and MyChart  Future DSME appointment: PRN, 4-6 wks

## 2022-05-26 ENCOUNTER — Emergency Department (HOSPITAL_BASED_OUTPATIENT_CLINIC_OR_DEPARTMENT_OTHER)
Admission: EM | Admit: 2022-05-26 | Discharge: 2022-05-26 | Disposition: A | Discharge status: Home / Self Care | Payer: 59 | Attending: Emergency Medicine | Admitting: Emergency Medicine

## 2022-05-26 ENCOUNTER — Other Ambulatory Visit: Payer: Self-pay

## 2022-05-26 ENCOUNTER — Ambulatory Visit: Payer: Self-pay | Admitting: *Deleted

## 2022-05-26 ENCOUNTER — Encounter (HOSPITAL_BASED_OUTPATIENT_CLINIC_OR_DEPARTMENT_OTHER): Payer: Self-pay | Admitting: Urology

## 2022-05-26 DIAGNOSIS — Z20822 Contact with and (suspected) exposure to covid-19: Secondary | ICD-10-CM | POA: Diagnosis not present

## 2022-05-26 DIAGNOSIS — R0981 Nasal congestion: Secondary | ICD-10-CM | POA: Diagnosis not present

## 2022-05-26 DIAGNOSIS — J029 Acute pharyngitis, unspecified: Secondary | ICD-10-CM | POA: Insufficient documentation

## 2022-05-26 LAB — RESP PANEL BY RT-PCR (RSV, FLU A&B, COVID)  RVPGX2
Influenza A by PCR: NEGATIVE
Influenza B by PCR: NEGATIVE
Resp Syncytial Virus by PCR: NEGATIVE
SARS Coronavirus 2 by RT PCR: NEGATIVE

## 2022-05-26 LAB — GROUP A STREP BY PCR: Group A Strep by PCR: NOT DETECTED

## 2022-05-26 MED ORDER — LIDOCAINE VISCOUS HCL 2 % MT SOLN: 15.0000 mL | OROMUCOSAL | 0 refills | Status: DC | PRN

## 2022-05-26 MED ORDER — LIDOCAINE VISCOUS HCL 2 % MT SOLN
15.0000 mL | Freq: Once | OROMUCOSAL | Status: AC
  Administered 2022-05-26: 15 mL via OROMUCOSAL
  Filled 2022-05-26: qty 15

## 2022-05-26 MED ORDER — IBUPROFEN 400 MG PO TABS
600.0000 mg | ORAL_TABLET | Freq: Once | ORAL | Status: AC
  Administered 2022-05-26: 600 mg via ORAL
  Filled 2022-05-26: qty 1

## 2022-05-26 NOTE — Discharge Instructions (Signed)
Evaluation today revealed that you likely have a viral pharyngitis.  Recommend conservative treatment at home.  This includes rest and hydration.  You can take ibuprofen and Tylenol for pain and symptomatic relief.  If you have new drooling, trouble swallowing or breathing please return to the emergency department for further evaluation.  Otherwise recommend you follow-up with your PCP if your symptoms persist.

## 2022-05-26 NOTE — ED Provider Notes (Signed)
[facility] [place] [facility] Provider Note   CSN: [id number] Arrival date & time: 05/26/22  [id number]     History  Chief Complaint  Patient presents with   Sore Throat   HPI Yvette Perez is a 32 y.o. female presenting for sore throat.  Started on Saturday.  Denies cough and fever.  States she has had some congestion.  States her boyfriend is also had similar symptoms.  States he was evaluated for strep throat and found to be negative.  States she took some Tylenol this morning for sore throat.  Symptoms are persistent which prompted her evaluation today in the ED.  Denies drooling, trouble swallowing or trouble breathing.   Sore Throat       Home Medications Prior to Admission medications   Medication Sig Start Date End Date Taking? Authorizing Provider  lidocaine (XYLOCAINE) 2 % solution Use as directed 15 mLs in the mouth or throat as needed for mouth pain. 05/26/22  Yes Harriet Pho, PA-C  Accu-Chek Softclix Lancets lancets Use as instructed 04/15/22   Mayers, Cari S, PA-C  amLODipine (NORVASC) 10 MG tablet Take 1 tablet (10 mg total) by mouth daily. 04/07/22   Mayers, Cari S, PA-C  atorvastatin (LIPITOR) 10 MG tablet Take 1 tablet (10 mg total) by mouth daily. Patient not taking: Reported on 05/21/2022 04/17/22   Mayers, Cari S, PA-C  Cyanocobalamin (VITAMIN B-12 PO) Take by mouth.    [provider]  etonogestrel (NEXPLANON) 68 MG IMPL implant 1 each by Subdermal route once.    [provider]  glucose blood (ACCU-CHEK GUIDE) test strip 1 each by Other route 2 (two) times daily. Use as instructed 04/15/22   Mayers, Cari S, PA-C  metFORMIN (GLUCOPHAGE-XR) 500 MG 24 hr tablet Take 1 tablet (500 mg total) by mouth daily with breakfast. 04/15/22   Mayers, Cari S, PA-C      Allergies    Patient has no known allergies.    Review of Systems   See HPI for pertinent positives  Physical Exam Updated Vital Signs BP (!) 130/98 (BP Location:  Right Arm)   Pulse (!) 110   Temp 98 F (36.7 C) (Oral)   Resp 18   Ht '5\' 6"'$  (1.676 m)   Wt 69.4 kg   SpO2 96%   BMI 24.69 kg/m  Physical Exam Constitutional:      Appearance: Normal appearance.  HENT:     Head: Normocephalic.     Nose: Nose normal.     Mouth/Throat:     Pharynx: Uvula midline. Posterior oropharyngeal erythema present. No pharyngeal swelling, oropharyngeal exudate or uvula swelling.     Tonsils: No tonsillar abscesses.  Eyes:     Conjunctiva/sclera: Conjunctivae normal.  Pulmonary:     Effort: Pulmonary effort is normal.     Breath sounds: No stridor.  Lymphadenopathy:     Cervical: No cervical adenopathy.  Neurological:     Mental Status: She is alert.  Psychiatric:        Mood and Affect: Mood normal.     ED Results / Procedures / Treatments   Labs (all labs ordered are listed, but only abnormal results are displayed) Labs Reviewed  GROUP A STREP BY PCR  RESP PANEL BY RT-PCR (RSV, FLU A&B, COVID)  [id number]    EKG None  Radiology No results found.  Procedures Procedures    Medications Ordered in ED Medications  lidocaine (XYLOCAINE) 2 % viscous mouth solution 15 mL (  has no administration in time range)  ibuprofen (ADVIL) tablet 600 mg (has no administration in time range)    ED Course/ Medical Decision Making/ A&P                             Medical Decision Making  32 year old female who is well-appearing and he medically stable presenting for sore throat.  Exam notable for erythema in the posterior pharynx but otherwise reassuring.  DDx includes strep throat, peritonsillar abscess, posterior pharyngeal abscess, deep space abscess in the neck, viral pharyngitis.  Respiratory PCR was negative for flu, COVID and RSV.  Strep PCR was also negative.  Clinical findings and HPI consistent with likely viral pharyngitis.  Treated her symptoms with viscous lidocaine and ibuprofen.  Patient states she felt much better.  Advised conservative  treatment at home.  Advised her to follow-up with her PCP if her symptoms persisted.  Discussed return precautions.  Sent viscous lidocaine to her pharmacy.        Final Clinical Impression(s) / ED Diagnoses Final diagnoses:  Sore throat    Rx / DC Orders ED Discharge Orders          Ordered    lidocaine (XYLOCAINE) 2 % solution  As needed        05/26/22 1254              Harriet Pho, PA-C 05/26/22 1254    Leanord Asal K, DO 05/26/22 1528

## 2022-05-26 NOTE — ED Triage Notes (Signed)
Pt states sore throat since Saturday  Denies fever, denies N/V

## 2022-05-26 NOTE — ED Notes (Signed)
Discharge paperwork reviewed entirely with patient, including Rx's and follow up care. Pain was under control. Pt verbalized understanding as well as all parties involved. No questions or concerns voiced at the time of discharge. No acute distress noted.   Pt ambulated out to PVA without incident or assistance.  

## 2022-05-26 NOTE — Telephone Encounter (Signed)
  Chief Complaint: Sore throat Symptoms: Sore throat, pus on tonsils Frequency: Saturday,, worsening Pertinent Negatives: Patient denies fever Disposition: '[]'$ ED /'[x]'$ Urgent Care (no appt availability in office) / '[]'$ Appointment(In office/virtual)/ '[]'$  [facility]/ '[]'$ Home Care/ '[]'$ Refused Recommended Disposition /'[]'$ [facility]/ '[]'$  Follow-up with PCP Additional Notes: No PCP. Advised UC states "I Can't pay there, going to hospital." Reason for Disposition  [1] Pus on tonsils (back of throat) AND [2]  fever AND [3] swollen neck lymph nodes ("glands")  Answer Assessment - Initial Assessment Questions 1. ONSET: "When did the throat start hurting?" (Hours or days ago)      Saturday, worse yesterday 2. SEVERITY: "How bad is the sore throat?" (Scale 1-10; mild, moderate or severe)   - MILD (1-3):  Doesn't interfere with eating or normal activities.   - MODERATE (4-7): Interferes with eating some solids and normal activities.   - SEVERE (8-10):  Excruciating pain, interferes with most normal activities.   - SEVERE WITH DYSPHAGIA (10): Can't swallow liquids, drooling.     9/10 3. STREP EXPOSURE: "Has there been any exposure to strep within the past week?" If Yes, ask: "What type of contact occurred?"      No 4.  VIRAL SYMPTOMS: "Are there any symptoms of a cold, such as a runny nose, cough, hoarse voice or red eyes?"      Cough at times 5. FEVER: "Do you have a fever?" If Yes, ask: "What is your temperature, how was it measured, and when did it start?"     No 6. PUS ON THE TONSILS: "Is there pus on the tonsils in the back of your throat?"     Yes 7. OTHER SYMPTOMS: "Do you have any other symptoms?" (e.g., difficulty breathing, headache, rash)     Cough, "Makes me want to throw up."  Protocols used: Sore Throat-A-AH

## 2022-05-29 ENCOUNTER — Other Ambulatory Visit: Payer: Self-pay | Admitting: Physician Assistant

## 2022-05-29 DIAGNOSIS — E119 Type 2 diabetes mellitus without complications: Secondary | ICD-10-CM

## 2022-05-30 NOTE — Telephone Encounter (Signed)
Pt has a new pt appt 07/15/22

## 2022-05-30 NOTE — Telephone Encounter (Signed)
Requested medication (s) are due for refill today: Yes  Requested medication (s) are on the active medication list: Yes  Last refill:  04/15/22  Future visit scheduled: Yes   Notes to clinic:  Pt. Seen by Carrolyn Meiers NP on Mobile Unit and filled. Can the practice refill since she has a new patient appointment coming up?    Requested Prescriptions  Pending Prescriptions Disp Refills   metFORMIN (GLUCOPHAGE-XR) 500 MG 24 hr tablet [Pharmacy Med Name: metFORMIN HCl ER 500 MG Oral Tablet Extended Release 24 Hour] 30 tablet 0    Sig: Take 1 tablet by mouth once daily with breakfast     There is no refill protocol information for this order

## 2022-06-02 ENCOUNTER — Other Ambulatory Visit: Payer: Self-pay | Admitting: Physician Assistant

## 2022-06-02 ENCOUNTER — Telehealth (INDEPENDENT_AMBULATORY_CARE_PROVIDER_SITE_OTHER): Payer: Self-pay | Admitting: General Practice

## 2022-06-02 DIAGNOSIS — E119 Type 2 diabetes mellitus without complications: Secondary | ICD-10-CM

## 2022-06-02 MED ORDER — METFORMIN HCL ER 500 MG PO TB24: 500.0000 mg | ORAL_TABLET | Freq: Every day | ORAL | 1 refills | Status: DC

## 2022-06-02 NOTE — Telephone Encounter (Signed)
Pt is calling in requesting for Sharyn Lull to send a 30 day supply of Metformin. Pt says she currently has a restricted prescription. Pt says another provider prescribed her medication for 3 days, but she needs a full supply. Pt says her pharmacy has faxed over paperwork and hasn't heard anything back from [facility]. Please advise.

## 2022-06-02 NOTE — Telephone Encounter (Signed)
error 

## 2022-06-02 NOTE — Progress Notes (Signed)
Courtesy refill, pending appt with PCP  Kennieth Rad, PA-C Physician Assistant [place] [email or web address]

## 2022-06-02 NOTE — Telephone Encounter (Signed)
Returned pt call and made aware that rx has been sent. Made pt aware that she will need to come to her appt on 4/23 for future refills. Pt states she understands and doesn't have any questions or concerns

## 2022-06-05 ENCOUNTER — Other Ambulatory Visit: Payer: Self-pay | Admitting: Physician Assistant

## 2022-06-05 DIAGNOSIS — I10 Essential (primary) hypertension: Secondary | ICD-10-CM

## 2022-06-06 NOTE — Telephone Encounter (Signed)
Will forward to provider. Pt has a new pt appt scheduled with you for 07/15/22

## 2022-06-06 NOTE — Telephone Encounter (Signed)
Patient called to f/u on the refill for amLODipine (NORVASC) 10 MG tablet, patient stated she only has one pill left  Please f/u with patient for further assistance

## 2022-06-09 ENCOUNTER — Other Ambulatory Visit: Payer: Self-pay | Admitting: Physician Assistant

## 2022-06-09 DIAGNOSIS — I10 Essential (primary) hypertension: Secondary | ICD-10-CM

## 2022-06-09 MED ORDER — AMLODIPINE BESYLATE 10 MG PO TABS: 10.0000 mg | ORAL_TABLET | Freq: Every day | ORAL | 1 refills | Status: DC

## 2022-06-09 NOTE — Progress Notes (Signed)
Courtesy refill sent, patient has upcoming appointment to establish with primary care provider July 15, 2022.  Yvette Rad, PA-C Physician Assistant [email or web address]

## 2022-07-07 ENCOUNTER — Encounter: Payer: Self-pay | Admitting: Registered"

## 2022-07-07 ENCOUNTER — Encounter: Payer: 59 | Attending: Physician Assistant | Admitting: Registered"

## 2022-07-07 DIAGNOSIS — E119 Type 2 diabetes mellitus without complications: Secondary | ICD-10-CM | POA: Diagnosis present

## 2022-07-07 NOTE — Patient Instructions (Addendum)
Great job on the changes you have made and brining down your blood sugar!  Consider adding some protein to your breakfast.  Work on coming up with a schedule that will help you get a block of at least 5 hrs sleep. When you are able to get that much sleep, check your blood sugar when you wake up so we can see what your "Fasting" blood sugar  Freestyle Libre 3 when prescribed to a discount pharmacy, you may be able to get the cash price of ~$80/month when insurance won't cover the cost.  You can try asking your pharmacy to see if they will verify, then ask your MD for a prescription. If you do get one, consider making another appointment to learn how to use it.  Your phone is compatible with the [place] 3 sensors so you can use it for the receiver instead of needing another Rx for that.

## 2022-07-07 NOTE — Progress Notes (Signed)
Diabetes Self-Management Education  Visit Type: Follow-up  Appt. Start Time: 6962 Appt. End Time: 0945  07/07/2022  [name], identified by name and date of birth, is a 32 y.o. female with a diagnosis of Diabetes: Type 2.   ASSESSMENT  There were no vitals taken for this visit. There is no height or weight on file to calculate BMI.  A1c 8.5%  SMBG: 106- 190 mg/dL Medication: Metformin at night  Pt is using MySugr app to log blood sugar. 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  Pt states she talked to her doctor about the CGM but was not able to get it. Pt found the [place] 3 app on her phone, it is compatible if she gets the sensors.  Pt reports changes since last visit that are helping her control BG: taking medicine at night, smaller portions, having fruit for snacks at work.  Pt states yesterday she ate 1/2 [place] sandwich before work and the other half around 1 am at work.   Pt states she does not get enough sleep to have energy to increase exercise. Although Pt BG is improving, she hasn't noticed improved energy. Pt states the switch to fruit for snacks at work is helping energy and the apple she ate yesterday helped her feel better at work.   Diabetes Self-Management Education - 07/07/22 0917       Visit Information   Visit Type Follow-up      Initial Visit   Diabetes Type Type 2    Are you taking your medications as prescribed? Yes      Complications   How often do you check your blood sugar? 3-4 times/day    Postprandial Blood glucose range (mg/dL) 952-841;324-401   [phone]   Number of hypoglycemic episodes per month 0      Dietary Intake   Breakfast cereal (cocopuffs), reduced mlk    Snack (morning) apple    Lunch 1/2 [place] sand    Snack (afternoon) plum    Dinner 1/2 [place] Quarry manager) water,  diet coke      Activity / Exercise   Activity / Exercise Type ADL's      Patient Education   Healthy Eating Plate Method    Monitoring Other (comment)   options for CGM     Individualized Goals (developed by patient)   Nutrition General guidelines for healthy choices and portions discussed    Medications Other (comment)    Monitoring  Test my blood glucose as discussed      Patient Self-Evaluation of Goals - Patient rates self as meeting previously set goals (% of time)   Nutrition 50 - 75 % (half of the time)    Physical Activity 25 - 50% (sometimes)    Medications >75% (most of the time)      Outcomes   Expected Outcomes Demonstrated interest in learning. Expect positive outcomes    Future DMSE 4-6 wks    Program Status Not Completed      Subsequent Visit   Since your last visit have you continued or begun to take your medications as prescribed? Yes             Individualized Plan for Diabetes Self-Management Training:   Learning Objective:  Patient will have a greater understanding of diabetes self-management. Patient  education plan is to attend individual and/or group sessions per assessed needs and concerns.  Patient Instructions  Randie Heinz job on the changes you have made and brining down your blood sugar!  Consider adding some protein to your breakfast.  Work on coming up with a schedule that will help you get a block of at least 5 hrs sleep. When you are able to get that much sleep, check your blood sugar when you wake up so we can see what your "Fasting" blood sugar  Freestyle Libre 3 when prescribed to a discount pharmacy, you may be able to get the cash price of ~$80/month when insurance won't cover the cost.  You can try asking your pharmacy to see if they will verify, then ask your MD for a prescription. If you do get one, consider making another appointment to learn how to use it.  Your phone is compatible with the [place] 3 sensors so you can use it for the  receiver instead of needing another Rx for that.  Expected Outcomes:  Demonstrated interest in learning. Expect positive outcomes  Education material provided: none  If problems or questions, patient to contact team via:  Phone and 4-6 weeks  Future DSME appointment: 4-6 wks

## 2022-07-15 ENCOUNTER — Telehealth (INDEPENDENT_AMBULATORY_CARE_PROVIDER_SITE_OTHER): Payer: Self-pay | Admitting: Primary Care

## 2022-07-15 ENCOUNTER — Ambulatory Visit (INDEPENDENT_AMBULATORY_CARE_PROVIDER_SITE_OTHER): Payer: 59 | Admitting: Primary Care

## 2022-07-15 ENCOUNTER — Ambulatory Visit (INDEPENDENT_AMBULATORY_CARE_PROVIDER_SITE_OTHER): Payer: Self-pay | Admitting: Primary Care

## 2022-07-15 NOTE — Telephone Encounter (Signed)
Patient was called and appointment was rescheduled  

## 2022-07-27 ENCOUNTER — Other Ambulatory Visit: Payer: Self-pay | Admitting: Physician Assistant

## 2022-07-27 DIAGNOSIS — E119 Type 2 diabetes mellitus without complications: Secondary | ICD-10-CM

## 2022-08-06 ENCOUNTER — Ambulatory Visit (INDEPENDENT_AMBULATORY_CARE_PROVIDER_SITE_OTHER): Payer: 59 | Admitting: Primary Care

## 2022-08-07 ENCOUNTER — Other Ambulatory Visit: Payer: Self-pay | Admitting: Physician Assistant

## 2022-08-07 DIAGNOSIS — I10 Essential (primary) hypertension: Secondary | ICD-10-CM

## 2022-08-07 DIAGNOSIS — E119 Type 2 diabetes mellitus without complications: Secondary | ICD-10-CM

## 2022-08-07 NOTE — Telephone Encounter (Signed)
Requested medication (s) are due for refill today: yes  Requested medication (s) are on the active medication list: yes    Last refill:      Amlodipine 06/09/22  #30  1 refill        Metformin  06/02/22  #30  1 refill  Future visit scheduled no  Notes to clinic:Historical provider, please review. Thank you.  Requested Prescriptions  Pending Prescriptions Disp Refills   amLODipine (NORVASC) 10 MG tablet [Pharmacy Med Name: amLODIPine Besylate 10 MG Oral Tablet] 30 tablet 0    Sig: Take 1 tablet by mouth once daily     There is no refill protocol information for this order     metFORMIN (GLUCOPHAGE-XR) 500 MG 24 hr tablet [Pharmacy Med Name: metFORMIN HCl ER 500 MG Oral Tablet Extended Release 24 Hour] 30 tablet 0    Sig: Take 1 tablet by mouth once daily with breakfast     There is no refill protocol information for this order

## 2022-08-08 ENCOUNTER — Ambulatory Visit (INDEPENDENT_AMBULATORY_CARE_PROVIDER_SITE_OTHER): Payer: 59 | Admitting: Primary Care

## 2022-08-08 ENCOUNTER — Encounter (INDEPENDENT_AMBULATORY_CARE_PROVIDER_SITE_OTHER): Payer: Self-pay | Admitting: Primary Care

## 2022-08-08 VITALS — BP 140/90 | HR 103 | Resp 16 | Ht 62.0 in | Wt 147.0 lb

## 2022-08-08 DIAGNOSIS — K5900 Constipation, unspecified: Secondary | ICD-10-CM

## 2022-08-08 DIAGNOSIS — E1169 Type 2 diabetes mellitus with other specified complication: Secondary | ICD-10-CM

## 2022-08-08 DIAGNOSIS — E119 Type 2 diabetes mellitus without complications: Secondary | ICD-10-CM | POA: Diagnosis not present

## 2022-08-08 DIAGNOSIS — E785 Hyperlipidemia, unspecified: Secondary | ICD-10-CM

## 2022-08-08 DIAGNOSIS — Z7689 Persons encountering health services in other specified circumstances: Secondary | ICD-10-CM

## 2022-08-08 DIAGNOSIS — I10 Essential (primary) hypertension: Secondary | ICD-10-CM

## 2022-08-08 LAB — POCT GLYCOSYLATED HEMOGLOBIN (HGB A1C): HbA1c, POC (controlled diabetic range): 6.5 % (ref 0.0–7.0)

## 2022-08-08 MED ORDER — LISINOPRIL 10 MG PO TABS: 10.0000 mg | ORAL_TABLET | Freq: Every day | ORAL | 1 refills | Status: DC

## 2022-08-08 MED ORDER — METFORMIN HCL ER 500 MG PO TB24: 500.0000 mg | ORAL_TABLET | Freq: Every day | ORAL | 1 refills | Status: DC

## 2022-08-08 MED ORDER — AMLODIPINE BESYLATE 10 MG PO TABS: 10.0000 mg | ORAL_TABLET | Freq: Every day | ORAL | 1 refills | Status: DC

## 2022-08-08 MED ORDER — LISINOPRIL 20 MG PO TABS: 20.0000 mg | ORAL_TABLET | Freq: Every day | ORAL | 1 refills | Status: DC

## 2022-08-08 MED ORDER — SORBITOL 70 % PO SOLN: 15.0000 mL | Freq: Every day | ORAL | 0 refills | Status: DC | PRN

## 2022-08-08 NOTE — Patient Instructions (Addendum)
Sorbitol Solution What is this medication? SORBITOL (SOR buh tol) treats occasional constipation. It works by increasing the amount of water your intestine absorbs. This softens the stool, making it easier to have a bowel movement. It also increases pressure, which prompts the muscles in your intestines to move stool. It belongs to a group of medications called laxatives. This medicine may be used for other purposes; ask your health care provider or pharmacist if you have questions. What should I tell my care team before I take this medication? They need to know if you have any of these conditions: Stomach or intestine problems An unusual or allergic reaction to sorbitol, fructose, other medications, foods, dyes, or preservatives Pregnant or trying to get pregnant Breast-feeding How should I use this medication? Take this medication by mouth. Take it as directed on prescription label at the same time every day. Use a specially marked oral syringe, spoon, or dropper to measure each dose. Ask your pharmacist if you do not have one. Household spoons are not accurate. You can take it with or without food. If it upsets your stomach, take it with food. This medication may also be used rectally with an enema. Follow the directions on the label. Wash your hands before and after use. Remove top from enema. Lubricate the tip of the bottle. Lie on your side with your lower leg straightened out and your upper leg bent forward towards your stomach. Lift upper buttock to expose the rectal area. Gently insert enema tip into the rectum. Squeeze the bottle until empty. Wait a few seconds before removing the bottle. Hold buttocks together for a few seconds. Remain lying down for about 15 minutes to avoid having the medication come out. Talk to your care team about the use of this medication in children. Overdosage: If you think you have taken too much of this medicine contact a poison control center or emergency room at  once. NOTE: This medicine is only for you. Do not share this medicine with others. What if I miss a dose? This does not apply. This medication is not for regular use, and should only be used as needed. What may interact with this medication? Interactions are not expected. This list may not describe all possible interactions. Give your health care provider a list of all the medicines, herbs, non-prescription drugs, or dietary supplements you use. Also tell them if you smoke, drink alcohol, or use illegal drugs. Some items may interact with your medicine. What should I watch for while using this medication? Tell your care team if your symptoms do not start to get better or if they get worse. Do not use this medication for longer than directed by your care team. This medication can be habit-forming. Long-term use can make your body depend on the laxative for regular bowel movements, damage the bowel, cause malnutrition, and cause problems with the amounts of water and salts in your body. If your constipation keeps returning, check with your care team. Do not use with any other laxatives. Drink fluids as directed to prevent dehydration. See your care team right away if you do not have a bowel movement after using this medication. What side effects may I notice from receiving this medication? Side effects that you should report to your care team as soon as possible: Allergic reactions--skin rash, itching, hives, swelling of the face, lips, tongue, or throat Side effects that usually do not require medical attention (report to your care team if they continue or are  bothersome): Diarrhea Gas Nausea Upset stomach Vomiting This list may not describe all possible side effects. Call your doctor for medical advice about side effects. You may report side effects to FDA at 1-800-FDA-1088. Where should I keep my medication? Keep out of the reach of children and pets. Store at room temperature between 15 and  30 degrees C (59 and 86 degrees F). Get rid of any unused medication after the expiration date. To get rid of medications that are no longer needed or have expired: Take the medications to a medication take-back program. Check with your pharmacy or law enforcement to find a location. If your cannot return the medication, check the label or package insert to see if the medication should be thrown out in the garbage or flushed down the toilet. If you are not sure, ask your care team. If it is safe to put it in the trash, take the medication out of the container. Mix the medication with cat litter, dirt, coffee grounds, or other unwanted substance. Seal the mixture in a bag or container. Put it in the trash. NOTE: This sheet is a summary. It may not cover all possible information. If you have questions about this medicine, talk to your doctor, pharmacist, or health care provider.  2023 Elsevier/Gold Standard (2020-12-17 00:00:00) Constipation, Adult Constipation is when a person has trouble pooping (having a bowel movement). When you have this condition, you may poop fewer than 3 times a week. Your poop (stool) may also be dry, hard, or bigger than normal. Follow these instructions at home: Eating and drinking  Eat foods that have a lot of fiber, such as: Fresh fruits and vegetables. Whole grains. Beans. Eat less of foods that are low in fiber and high in fat and sugar, such as: [place] fries. Hamburgers. Cookies. Candy. Soda. Drink enough fluid to keep your pee (urine) pale yellow. General instructions Exercise regularly or as told by your doctor. Try to do 150 minutes of exercise each week. Go to the restroom when you feel like you need to poop. Do not hold it in. Take over-the-counter and prescription medicines only as told by your doctor. These include any fiber supplements. When you poop: Do deep breathing while relaxing your lower belly (abdomen). Relax your pelvic floor. The pelvic  floor is a group of muscles that support the rectum, bladder, and intestines (as well as the uterus in women). Watch your condition for any changes. Tell your doctor if you notice any. Keep all follow-up visits as told by your doctor. This is important. Contact a doctor if: You have pain that gets worse. You have a fever. You have not pooped for 4 days. You vomit. You are not hungry. You lose weight. You are bleeding from the opening of the butt (anus). You have thin, pencil-like poop. Get help right away if: You have a fever, and your symptoms suddenly get worse. You leak poop or have blood in your poop. Your belly feels hard or bigger than normal (bloated). You have very bad belly pain. You feel dizzy or you faint. Summary Constipation is when a person poops fewer than 3 times a week, has trouble pooping, or has poop that is dry, hard, or bigger than normal. Eat foods that have a lot of fiber. Drink enough fluid to keep your pee (urine) pale yellow. Take over-the-counter and prescription medicines only as told by your doctor. These include any fiber supplements. This information is not intended to replace advice given to you  by your health care provider. Make sure you discuss any questions you have with your health care provider. Document Revised: 01/22/2022 Document Reviewed: 01/22/2022 Elsevier Patient Education  2023 [employer].

## 2022-08-08 NOTE — Progress Notes (Signed)
New Patient Office Visit  Subjective    Patient ID: Yvette Perez, female    DOB: 05-Mar-1991  Age: 32 y.o. MRN: [id number]  CC: HTN /DM    HPI Ms.Fey Dorsi is a 32 year old  overweight  female presents to establish care. Bp is elevated . She knows her Bp is elevated when she has a h/a. Patient has No chest pain, No abdominal pain - No Nausea, No new weakness tingling or numbness, No Cough - shortness of breath. She works night and left work and came to her appt. Retook Bp significant difference. She monitors Bp at home readings are systolic  99-144 and 69-104. She is adherent with medication. Diabetes she has polyuria.  Denies polydipsia, polyphagia, or vision changes. BS readings 120-150-fasting   Outpatient Encounter Medications as of 08/08/2022  Medication Sig   Accu-Chek Softclix Lancets lancets Use as instructed   amLODipine (NORVASC) 10 MG tablet Take 1 tablet (10 mg total) by mouth daily.   atorvastatin (LIPITOR) 10 MG tablet Take 1 tablet (10 mg total) by mouth daily. (Patient not taking: Reported on 05/21/2022)   Cyanocobalamin (VITAMIN B-12 PO) Take by mouth.   etonogestrel (NEXPLANON) 68 MG IMPL implant 1 each by Subdermal route once.   glucose blood (ACCU-CHEK GUIDE) test strip 1 each by Other route 2 (two) times daily. Use as instructed   lidocaine (XYLOCAINE) 2 % solution Use as directed 15 mLs in the mouth or throat as needed for mouth pain.   metFORMIN (GLUCOPHAGE-XR) 500 MG 24 hr tablet Take 1 tablet (500 mg total) by mouth daily with breakfast.   No facility-administered encounter medications on file as of 08/08/2022.    Past Medical History:  Diagnosis Date   Medical history non-contributory    Renal disorder    UTI (urinary tract infection)     Past Surgical History:  Procedure Laterality Date   NO PAST SURGERIES      No family history on file.  Social History   Socioeconomic History   Marital status: Single    Spouse name: Not on file   Number of  children: Not on file   Years of education: Not on file   Highest education level: Not on file  Occupational History   Not on file  Tobacco Use   Smoking status: Every Day    Packs/day: 1    Types: Cigars, Cigarettes   Smokeless tobacco: Never  Substance and Sexual Activity   Alcohol use: No   Drug use: No   Sexual activity: Not on file  Other Topics Concern   Not on file  Social History Narrative   Not on file   Social Determinants of Health   Financial Resource Strain: Not on file  Food Insecurity: Food Insecurity Present (05/21/2022)   Hunger Vital Sign    Worried About Running Out of Food in the Last Year: Never true    Ran Out of Food in the Last Year: Sometimes true  Transportation Needs: Not on file  Physical Activity: Not on file  Stress: Not on file  Social Connections: Not on file  Intimate Partner Violence: Not on file    ROS Comprehensive ROS Pertinent positive and negative noted in HPI      Objective   Blood Pressure (Abnormal) 149/102 (BP Location: Left Arm, Patient Position: Sitting, Cuff Size: Normal)   Pulse (Abnormal) 103   Respiration 16   Height 5\' 2"  (1.575 m)   Weight 147 lb (66.7 kg)  Oxygen Saturation 99%   Body Mass Index 26.89 kg/m  Physical exam: General: Vital signs reviewed.  Patient is well-developed and well-nourished, overweight  in no acute distress and cooperative with exam. Head: Normocephalic and atraumatic. Eyes: EOMI, conjunctivae normal, no scleral icterus. Neck: Supple, trachea midline, normal ROM, no JVD, masses, thyromegaly, or carotid bruit present. Cardiovascular: RRR, S1 normal, S2 normal, no murmurs, gallops, or rubs. Pulmonary/Chest: Clear to auscultation bilaterally, no wheezes, rales, or rhonchi. Abdominal: Soft, non-tender, non-distended, BS +, no masses, organomegaly, or guarding present. Musculoskeletal: No joint deformities, erythema, or stiffness, ROM full and nontender. Extremities: No lower extremity edema  bilaterally,  pulses symmetric and intact bilaterally. No cyanosis or clubbing. Neurological: A&O x3, Strength is normal Skin: Warm, dry and intact. No rashes or erythema. Psychiatric: Normal mood and affect. speech and behavior is normal. Cognition and memory are normal.        Assessment & Plan:  Sakiya was seen today for new patient (initial visit), diabetes and hypertension.  Diagnoses and all orders for this visit:  Encounter to establish care   New onset type 2 diabetes mellitus (HCC) - educated on lifestyle modifications, including but not limited to diet choices and adding exercise to daily routine.   -     POCT glycosylated hemoglobin (Hb A1C) 6.5 -     metFORMIN (GLUCOPHAGE-XR) 500 MG 24 hr tablet; Take 1 tablet (500 mg total) by mouth daily with breakfast.  Essential hypertension BP goal - < 130/80 Explained that having normal blood pressure is the goal and medications are helping to get to goal and maintain normal blood pressure. DIET: Limit salt intake, read nutrition labels to check salt content, limit fried and high fatty foods  Avoid using multisymptom OTC cold preparations that generally contain sudafed which can rise BP. Consult with pharmacist on best cold relief products to use for persons with HTN EXERCISE Discussed incorporating exercise such as walking - 30 minutes most days of the week and can do in 10 minute intervals    -     Discontinue: lisinopril (ZESTRIL) 10 MG tablet; Take 1 tablet (10 mg total) by mouth daily. Increased to 20mg  daily. Take 2 until gone . New prescription 20mg  sent  -     amLODipine (NORVASC) 10 MG tablet; Take 1 tablet (10 mg total) by mouth daily. -     lisinopril (ZESTRIL) 10 MG tablet; Take 1 tablet (10 mg total) by mouth daily.  Hyperlipidemia associated with type 2 diabetes mellitus (HCC) Stopped statin caused constipation. Discussed risk factors agreed to every other day and take sorbitol the day of medication. Educated on diet  re-evaluate in 3 months and can purchase omega 3 acid daily.  Constipation, unspecified constipation type See AVS sorbitol     Grayce Sessions, NP

## 2022-08-11 ENCOUNTER — Ambulatory Visit: Payer: Self-pay | Admitting: *Deleted

## 2022-08-11 NOTE — Telephone Encounter (Signed)
Summary: medication question   Pt called asking about the medications that were prescribed to her at her last visit.  She wants to know what they are for.  CB#  [phone]           Chief Complaint: what are medications lisinopril and sorbitol for Symptoms: na  Frequency: na  Pertinent Negatives: Patient denies na  Disposition: [] ED /[] Urgent Care (no appt availability in office) / [] Appointment(In office/virtual)/ []  [place] [facility]/ [x] Home Care/ [] Refused Recommended Disposition /[]  Mobile Bus/ []  Follow-up with PCP Additional Notes:   Reviewed with patient lisinopril used to decrease fluid and help decrease BP. Patient concerned she was already taking norvasc and concerned BP would get too low. Reviewed multiple BP and averaged 120's /80's. Reviewed sorbitol used for constipation. Patient verbalized understanding.     Reason for Disposition  Caller has medicine question only, adult not sick, AND triager answers question  Answer Assessment - Initial Assessment Questions 1. NAME of MEDICINE: "What medicine(s) are you calling about?"     Lisinopril and sorbitol 2. QUESTION: "What is your question?" (e.g., double dose of medicine, side effect)     What medications are for? 3. PRESCRIBER: "Who prescribed the medicine?" Reason: if prescribed by specialist, call should be referred to that group.     PCP 4. SYMPTOMS: "Do you have any symptoms?" If Yes, ask: "What symptoms are you having?"  "How bad are the symptoms (e.g., mild, moderate, severe)     Elevated BP and constipation at times 5. PREGNANCY:  "Is there any chance that you are pregnant?" "When was your last menstrual period?"     Na  Protocols used: Medication Question Call-A-AH

## 2022-08-12 ENCOUNTER — Ambulatory Visit (INDEPENDENT_AMBULATORY_CARE_PROVIDER_SITE_OTHER): Payer: Self-pay

## 2022-08-12 NOTE — Telephone Encounter (Signed)
Noted  

## 2022-08-12 NOTE — Telephone Encounter (Signed)
Message from Randol Kern sent at 08/12/2022  9:18 AM EDT  Summary: Medication questions   Pt called requesting to speak to a nurse about two medications that she believes are the same with just different MG amounts.  Best contact: [phone]         Chief Complaint: what dose of lisinopril am I taking Symptoms: n/a Frequency: n/a Pertinent Negatives: Patient denies n/a Disposition: [] ED /[] Urgent Care (no appt availability in office) / [] Appointment(In office/virtual)/ []  [place] [facility]/ [x] Home Care/ [] Refused Recommended Disposition /[] [place] [facility]/ []  Follow-up with PCP Additional Notes: pt received a new bottle of lisinopril 20 mg. Pt stated she does not know what dose she is supposed to be on. Advised pt on 08/08/22 her dose was increased to 20 mg form 10 mg. Advised pt to take 2 of the 10 mg tablets as per PCP note. Advised pt dose is 20 mg now. Pt verbalized understanding.   Reason for Disposition  Caller has medicine question only, adult not sick, AND triager answers question  Answer Assessment - Initial Assessment Questions 1. NAME of MEDICINE: "What medicine(s) are you calling about?"     lisnopril 2. QUESTION: "What is your question?" (e.g., double dose of medicine, side effect)     What dose an I supposed to be taking  3. PRESCRIBER: "Who prescribed the medicine?" Reason: if prescribed by specialist, call should be referred to that group.     M. Edwards FNP 4. SYMPTOMS: "Do you have any symptoms?" If Yes, ask: "What symptoms are you having?"  "How bad are the symptoms (e.g., mild, moderate, severe)     N/a 5. PREGNANCY:  "Is there any chance that you are pregnant?" "When was your last menstrual period?"     N/a  Protocols used: Medication Question Call-A-AH

## 2022-09-01 ENCOUNTER — Ambulatory Visit (INDEPENDENT_AMBULATORY_CARE_PROVIDER_SITE_OTHER): Payer: 59 | Admitting: Primary Care

## 2022-09-01 ENCOUNTER — Other Ambulatory Visit (HOSPITAL_COMMUNITY)
Admission: RE | Admit: 2022-09-01 | Discharge: 2022-09-01 | Disposition: A | Discharge status: Home / Self Care | Payer: 59 | Source: Ambulatory Visit | Attending: Primary Care | Admitting: Primary Care

## 2022-09-01 ENCOUNTER — Encounter (INDEPENDENT_AMBULATORY_CARE_PROVIDER_SITE_OTHER): Payer: Self-pay | Admitting: Primary Care

## 2022-09-01 VITALS — BP 122/86 | HR 94 | Resp 16 | Wt 146.8 lb

## 2022-09-01 DIAGNOSIS — K649 Unspecified hemorrhoids: Secondary | ICD-10-CM | POA: Diagnosis not present

## 2022-09-01 DIAGNOSIS — R300 Dysuria: Secondary | ICD-10-CM | POA: Diagnosis present

## 2022-09-01 DIAGNOSIS — K602 Anal fissure, unspecified: Secondary | ICD-10-CM | POA: Diagnosis not present

## 2022-09-01 DIAGNOSIS — R3914 Feeling of incomplete bladder emptying: Secondary | ICD-10-CM

## 2022-09-01 LAB — POCT URINALYSIS DIP (CLINITEK)
Bilirubin, UA: NEGATIVE
Glucose, UA: NEGATIVE mg/dL
Ketones, POC UA: NEGATIVE mg/dL
Leukocytes, UA: NEGATIVE
Nitrite, UA: NEGATIVE
POC PROTEIN,UA: NEGATIVE
Spec Grav, UA: 1.015 (ref 1.010–1.025)
Urobilinogen, UA: 1 E.U./dL
pH, UA: 6 (ref 5.0–8.0)

## 2022-09-01 NOTE — Progress Notes (Signed)
[facility]  Yvette Perez, is a 32 y.o. female  YNW:[id number]  MVH:[id number]  DOB - 01/25/1991  Chief Complaint  Patient presents with   Blood Pressure Check   Hemorrhoids    Possible Stated yesterday No blood in stool        Subjective:   Yvette Perez is a 32 y.o. female here today for a follow up visit. HTN. Patient has No headache, No chest pain, No abdominal pain - No Nausea, No new weakness tingling or numbness, No Cough - shortness of breath. She also concern if she has a hemorrhoid.  Pain when having a BM.  No problems updated.  No Known Allergies  Past Medical History:  Diagnosis Date   Medical history non-contributory    Renal disorder    UTI (urinary tract infection)     Current Outpatient Medications on File Prior to Visit  Medication Sig Dispense Refill   Accu-Chek Softclix Lancets lancets Use as instructed 100 each 12   amLODipine (NORVASC) 10 MG tablet Take 1 tablet (10 mg total) by mouth daily. 30 tablet 1   atorvastatin (LIPITOR) 10 MG tablet Take 1 tablet (10 mg total) by mouth daily. (Patient not taking: Reported on 05/21/2022) 90 tablet 1   Cyanocobalamin (VITAMIN B-12 PO) Take by mouth.     etonogestrel (NEXPLANON) 68 MG IMPL implant 1 each by Subdermal route once.     glucose blood (ACCU-CHEK GUIDE) test strip 1 each by Other route 2 (two) times daily. Use as instructed 100 each 12   lidocaine (XYLOCAINE) 2 % solution Use as directed 15 mLs in the mouth or throat as needed for mouth pain. 100 mL 0   lisinopril (ZESTRIL) 20 MG tablet Take 1 tablet (20 mg total) by mouth daily. 90 tablet 1   metFORMIN (GLUCOPHAGE-XR) 500 MG 24 hr tablet Take 1 tablet (500 mg total) by mouth daily with breakfast. 90 tablet 1   sorbitol 70 % solution Take 15 mLs by mouth daily as needed. 473 mL 0   No current facility-administered medications on file prior to visit.    Objective:   Vitals:   09/01/22 1005  BP: 122/86  Pulse: 94  Resp: 16  SpO2:  100%  Weight: 146 lb 12.8 oz (66.6 kg)    Comprehensive ROS Pertinent positive and negative noted in HPI   Exam General appearance : Awake, alert, not in any distress. Speech Clear. Not toxic looking HEENT: Atraumatic and Normocephalic, pupils equally reactive to light and accomodation Neck: Supple, no JVD. No cervical lymphadenopathy.  Chest: Good air entry bilaterally, no added sounds  CVS: S1 S2 regular, no murmurs.  Anus felt small tear or hemorrhoid Abdomen: Bowel sounds present, Non tender and not distended with no gaurding, rigidity or rebound. Extremities: B/L Lower Ext shows no edema, both legs are warm to touch Neurology: Awake alert, and oriented X 3, CN II-XII intact, Non focal Skin: No Rash  Data Review Lab Results  Component Value Date   HGBA1C 6.5 08/08/2022   HGBA1C 8.5 (A) 04/15/2022    Assessment & Plan  Kamaile was seen today for blood pressure check and hemorrhoids.  Diagnoses and all orders for this visit:  Dysuria Esp after sex -     Cervicovaginal ancillary only  Feeling of incomplete bladder emptying -     POCT URINALYSIS DIP (CLINITEK) Negative for UTI  Anal fissure 2/2 Hemorrhoids, unspecified hemorrhoid type Opt out of GI will use stool softner/laxative and increase fiber and water.  Unsure if insurance will cover GI.    Patient have been counseled extensively about nutrition and exercise. Other issues discussed during this visit include: low cholesterol diet, weight control and daily exercise, foot care, annual eye examinations at Ophthalmology, importance of adherence with medications and regular follow-up. We also discussed long term complications of uncontrolled diabetes and hypertension.   Return in about 6 weeks (around 10/13/2022) for pap.  The patient was given clear instructions to go to ER or return to medical center if symptoms don't improve, worsen or new problems develop. The patient verbalized understanding. The patient was told to  call to get lab results if they haven't heard anything in the next week.   This note has been created with Education officer, environmental. Any transcriptional errors are unintentional.   Grayce Sessions, NP 09/01/2022, 1:32 PM

## 2022-09-03 ENCOUNTER — Other Ambulatory Visit (INDEPENDENT_AMBULATORY_CARE_PROVIDER_SITE_OTHER): Payer: Self-pay | Admitting: Primary Care

## 2022-09-03 DIAGNOSIS — B9689 Other specified bacterial agents as the cause of diseases classified elsewhere: Secondary | ICD-10-CM

## 2022-09-03 DIAGNOSIS — B379 Candidiasis, unspecified: Secondary | ICD-10-CM

## 2022-09-03 LAB — CERVICOVAGINAL ANCILLARY ONLY
Bacterial Vaginitis (gardnerella): POSITIVE — AB
Candida Glabrata: NEGATIVE
Candida Vaginitis: POSITIVE — AB
Chlamydia: NEGATIVE
Comment: NEGATIVE
Comment: NEGATIVE
Comment: NEGATIVE
Comment: NEGATIVE
Comment: NEGATIVE
Comment: NORMAL
Neisseria Gonorrhea: NEGATIVE
Trichomonas: NEGATIVE

## 2022-09-03 MED ORDER — FLUCONAZOLE 150 MG PO TABS: 150.0000 mg | ORAL_TABLET | Freq: Every day | ORAL | 1 refills | Status: DC

## 2022-09-03 MED ORDER — METRONIDAZOLE 500 MG PO TABS: 500.0000 mg | ORAL_TABLET | Freq: Two times a day (BID) | ORAL | 0 refills | Status: DC

## 2022-09-08 ENCOUNTER — Ambulatory Visit (INDEPENDENT_AMBULATORY_CARE_PROVIDER_SITE_OTHER): Payer: Self-pay | Admitting: *Deleted

## 2022-09-08 ENCOUNTER — Encounter (INDEPENDENT_AMBULATORY_CARE_PROVIDER_SITE_OTHER): Payer: Self-pay

## 2022-09-08 NOTE — Telephone Encounter (Signed)
Attempted to return her call.   Left a voicemail to call back so a nurse could assist her.  (The answer to her question is in the MyChart message Gwinda Passe, NP sent her today regarding the bacterial vaginosis).

## 2022-09-08 NOTE — Telephone Encounter (Signed)
Message from [place] sent at 09/08/2022  2:15 PM EDT  Summary: med info   Pt called in wants to know that the antibiotic , metroNIDAZOLE (FLAGYL) 500 MG tablet pill she was given is for?         Chief Complaint: pt wanted to know whyon 2 medications for infection   Disposition: [] ED /[] Urgent Care (no appt availability in office) / [] Appointment(In office/virtual)/ []  [place] [facility]/ [x] Home Care/ [] Refused Recommended Disposition /[] [place] [facility]/ []  Follow-up with PCP Additional Notes: Pharmacist advised pt to take Diflucan first. Pt stated she took the pill today - advised pt to start abx as directed and will forward question about Diflucan reorder?  Reason for Disposition  [1] Caller has NON-URGENT medicine question about med that PCP prescribed AND [2] triager unable to answer question  Answer Assessment - Initial Assessment Questions 1. NAME of MEDICINE: "What medicine(s) are you calling about?"     Diflucan and Flagyl 2. QUESTION: "What is your question?" (e.g., double dose of medicine, side effect)     Why am I taking 2 medications for infection- Pharmacist told pt to take the Diflucan first  3. PRESCRIBER: "Who prescribed the medicine?" Reason: if prescribed by specialist, call should be referred to that group.     M. Edwards FNP 4. SYMPTOMS: "Do you have any symptoms?" If Yes, ask: "What symptoms are you having?"  "How bad are the symptoms (e.g., mild, moderate, severe)     Dx with BV and yeast infection  Protocols used: Medication Question Call-A-AH

## 2022-09-08 NOTE — Telephone Encounter (Signed)
Returned pt call. Pt states she already spoke with the nurse in regards to the medication. Pt states she took the diflucan first before taking the antibiotics. Pt is requesting another rx for the diflucan

## 2022-09-08 NOTE — Telephone Encounter (Signed)
Message from [place] sent at 09/08/2022  2:15 PM EDT  Summary: med info   Pt called in wants to know that the antibiotic , metroNIDAZOLE (FLAGYL) 500 MG tablet pill she was given is for?          Call History   Type Contact Phone/Fax User  09/08/2022 02:13 PM EDT Phone ([address]) Lillyann, [place] (Self) [phone] Rexene Edison) Eliseo Gum, Deedra Ehrich

## 2022-09-10 NOTE — Telephone Encounter (Signed)
Contacted pt and made aware  

## 2022-09-12 ENCOUNTER — Telehealth (INDEPENDENT_AMBULATORY_CARE_PROVIDER_SITE_OTHER): Payer: Self-pay | Admitting: Primary Care

## 2022-09-12 NOTE — Telephone Encounter (Signed)
Pt reports that [facility] told her that they have not received a refill request.  Medication Refill - Medication: metroNIDAZOLE (FLAGYL) 500 MG tablet   Has the patient contacted their pharmacy? Yes.    Preferred Pharmacy (with phone number or street name):  [facility] [address] [place]), [place] - [id number] Phone: [phone]  Fax: [phone]     Has the patient been seen for an appointment in the last year OR does the patient have an upcoming appointment? Yes.    Agent: Please be advised that RX refills may take up to 3 business days. We ask that you follow-up with your pharmacy.

## 2022-09-12 NOTE — Telephone Encounter (Signed)
Called the patient and she confirmed that the wrong Rx refill was requested. She actually needs a refill on the Diflucan Rx. Will call the pharmacy to verify if refill is available for pick-up.  Spoke to [place] [employer] who stated that the patient does have a Rx refill available for the Fluconazole and will fill it today. Patient will be notified by pharmacy when it is available for pick up.

## 2022-09-15 ENCOUNTER — Encounter: Payer: Self-pay | Admitting: Registered"

## 2022-09-15 ENCOUNTER — Encounter: Payer: 59 | Attending: Physician Assistant | Admitting: Registered"

## 2022-09-15 VITALS — Wt 148.5 lb

## 2022-09-15 DIAGNOSIS — E119 Type 2 diabetes mellitus without complications: Secondary | ICD-10-CM | POA: Insufficient documentation

## 2022-09-15 NOTE — Patient Instructions (Addendum)
Great job on lowering your A1c to 6.5% Keep working on getting a longer block of sleep Continue with the routine you have been trying: When you get off work 8 or 9 am, going to sleep around 10 am sleep 4 pm. Things to try to get more quality sleep: Black out curtains avoid caffeine 4-6 hrs before sleep limit activity before bed to something physical A 10-15 min walk before going to sleep may make a big difference. try not to look at screens or do anything that gets your mind going or worrying about things.   Contact your doctor if your fasting numbers continue to be elevated and if your blood pressure stays elevated  Use the handout for proper technique for checking blood pressure

## 2022-09-15 NOTE — Progress Notes (Signed)
Diabetes Self-Management Education  Visit Type: Follow-up  Appt. Start Time: 0920 Appt. End Time: 1000  09/15/2022  [name], identified by name and date of birth, is a 32 y.o. female with a diagnosis of Diabetes:  .   ASSESSMENT  Weight 148 lb 8 oz (67.4 kg). Body mass index is 27.16 kg/m.  Wt Readings from Last 3 Encounters:  09/15/22 148 lb 8 oz (67.4 kg)  09/01/22 146 lb 12.8 oz (66.6 kg)  08/08/22 147 lb (66.7 kg)   Since 12/13/20 weight has fluctuated 146 - 162 lbs, looks like 150 lbs is avg.  Lab Results  Component Value Date   HGBA1C 6.5 08/08/2022  Down from 8.4% on 04/15/22  Pt states CGM is not covered by insurance, continues to check blood sugar 2x/day  FBS: pt states recently has been 130-140's; prior was in the low 100's PPBG: pt reports mostly WNL 2-hr ppbg  Pt states she started taking lisinopril since our last visit and thinks it is causing her high FBS. Pt states her doctor told her it might increase blood sugar some.  Pt states she checks blood pressure at night and the upper number is okay, but the lower (diastolic number) continues to be high, pt reports 115-135/ 80-90  Pt states after her first diabetes education visit she reduced her portions and stopped having snacks. Pt has lost ~7 lbs with changes, and weight loss has hit a  plateau. Pt was not intentionally trying to lose weight, but making efforts to reduce A1c.  Pt states she is not sure if she is eating enough since changing her eating habits,. Pt states she is very hungry in the morning, but also states she is not eating snacks. Discussed having snacks is fine, but choosing healthier snacks is the goal.  Pt states her biggest meal is between 5-6 pm before going to work.  Today's 24-hr recall is not typical, pt states she was out of town yesterday for son's birthday  Pt states she has tried working on her goal of getting a longer block of time sleeping instead of breaking into sleeping 2  times a day. Pt states it is hard for her to sleep when she gets home from work because she feels like she needs to get stuff done.    Diabetes Self-Management Education - 09/15/22 0923       Visit Information   Visit Type Follow-up      Complications   Last HgB A1C per patient/outside source 6.5 %    Fasting Blood glucose range (mg/dL) 30-865;784-696    Postprandial Blood glucose range (mg/dL) 295-284      Dietary Intake   Breakfast waffle, egg, sausage, coffee, apple    Dinner wings, fries, water    Snack (evening) wings, bread, zero coke      Patient Education   Being Active Other (comment)   exercise before sleep to help FBS   Diabetes Stress and Support Role of stress on diabetes;Other (comment)   sleep     Individualized Goals (developed by patient)   Monitoring  Test my blood glucose as discussed    Problem Solving Sleep Pattern      Outcomes   Expected Outcomes Demonstrated interest in learning. Expect positive outcomes    Future DMSE 2 months    Program Status Completed      Subsequent Visit   Since your last visit have you continued or begun to take your medications as prescribed? Yes  Since your last visit have you had your blood pressure checked? Yes    Is your most recent blood pressure lower, unchanged, or higher since your last visit? Higher    Since your last visit, are you checking your blood glucose at least once a day? Yes            Individualized Plan for Diabetes Self-Management Training:  Learning Objective:  Patient will have a greater understanding of diabetes self-management. Patient education plan is to attend individual and/or group sessions per assessed needs and concerns.  Patient Instructions  Great job on lowering your A1c to 6.5% Keep working on getting a longer block of sleep Continue with the routine you have been trying: When you get off work 8 or 9 am, going to sleep around 10 am sleep 4 pm. Things to try to get more quality  sleep: Black out curtains avoid caffeine 4-6 hrs before sleep limit activity before bed to something physical A 10-15 min walk before going to sleep may make a big difference. try not to look at screens or do anything that gets your mind going or worrying about things.   Contact your doctor if your fasting numbers continue to be elevated and if your blood pressure stays elevated  Use the handout for proper technique for checking blood pressure  Expected Outcomes:  Demonstrated interest in learning. Expect positive outcomes  Education material provided: Am Heart Association Blood Pressure Measurement instructions  If problems or questions, patient to contact team via:  Phone and MyChart  Future DSME appointment: 2 months

## 2022-10-02 ENCOUNTER — Other Ambulatory Visit (INDEPENDENT_AMBULATORY_CARE_PROVIDER_SITE_OTHER): Payer: Self-pay | Admitting: Primary Care

## 2022-10-02 DIAGNOSIS — I10 Essential (primary) hypertension: Secondary | ICD-10-CM

## 2022-10-03 NOTE — Telephone Encounter (Signed)
Requested medications are due for refill today.  yes  Requested medications are on the active medications list.  yes  Last refill. 08/08/2022 #30 1 rf  Future visit scheduled.   yes  Notes to clinic.  No PCP listed.    Requested Prescriptions  Pending Prescriptions Disp Refills   amLODipine (NORVASC) 10 MG tablet [Pharmacy Med Name: amLODIPine Besylate 10 MG Oral Tablet] 30 tablet 0    Sig: Take 1 tablet by mouth once daily     Cardiovascular: Calcium Channel Blockers 2 Passed - 10/02/2022  8:19 PM      Passed - Last BP in normal range    BP Readings from Last 1 Encounters:  09/01/22 122/86         Passed - Last Heart Rate in normal range    Pulse Readings from Last 1 Encounters:  09/01/22 94         Passed - Valid encounter within last 6 months    Recent Outpatient Visits           1 month ago Dysuria   [place] [facility] Grayce Sessions, NP   1 month ago New onset type 2 diabetes mellitus [facility]   [place] [facility] Grayce Sessions, NP       Future Appointments             In 3 weeks Randa Evens, Kinnie Scales, NP [place] [facility]

## 2022-10-06 ENCOUNTER — Ambulatory Visit (INDEPENDENT_AMBULATORY_CARE_PROVIDER_SITE_OTHER): Payer: Self-pay | Admitting: *Deleted

## 2022-10-06 NOTE — Telephone Encounter (Signed)
 Will forward to provider  

## 2022-10-06 NOTE — Telephone Encounter (Signed)
Return pt call left vm. Repeat labs fasting f/u

## 2022-10-06 NOTE — Telephone Encounter (Signed)
Message from [place] B sent at 10/06/2022 10:19 AM EDT  Summary: directions for med sent to pharmacy.   Pt called in states, [facility] Dr Randa Evens changed directions on her taking her cholesterol med, and  pharmacy needs new instructions sent saying that she is supposed to take it(Atorvastatin) twice a day, every other day.  Pharmacy phone # [phone]          Call History  Contact Date/Time Type Contact Phone/Fax User  10/06/2022 10:15 AM EDT Phone ([address] Kaitlynne, [place] (Self) [phone] Rexene Edison) Eliseo Gum, Deedra Ehrich

## 2022-10-06 NOTE — Telephone Encounter (Signed)
Message sent to Gwinda Passe, NP regarding clarification of the dosing for Atorvastatin.    Looks like per [employer] note that pt is to take atorvastatin every other day.   Use Sorbitol also because the atorvastatin makes her constipated.     Pharmacy needing a new rx.

## 2022-10-07 ENCOUNTER — Telehealth: Payer: Self-pay | Admitting: General Practice

## 2022-10-07 ENCOUNTER — Telehealth (INDEPENDENT_AMBULATORY_CARE_PROVIDER_SITE_OTHER): Payer: Self-pay | Admitting: General Practice

## 2022-10-07 ENCOUNTER — Telehealth: Payer: Self-pay | Admitting: Primary Care

## 2022-10-07 NOTE — Telephone Encounter (Signed)
Pt has been called and pt will be coming to the office tomorrow 7/17 for lab visit

## 2022-10-07 NOTE — Telephone Encounter (Signed)
Copied from CRM [id number]. Topic: General - Inquiry >> Oct 07, 2022  4:38 PM Patsy Lager T wrote: Reason for CRM: patient wants someone to call her back to tell her why the provider is needing her to do her labs over.

## 2022-10-07 NOTE — Telephone Encounter (Signed)
Copied from CRM [id number]. Topic: General - Inquiry >> Oct 07, 2022  4:38 PM Patsy Lager T wrote: Reason for CRM: patient wants someone to call her back to tell her why the provider is needing her to do her labs over.

## 2022-10-07 NOTE — Telephone Encounter (Signed)
Medication Refill - Medication: atorvastatin (LIPITOR) 10 MG tablet   Has the patient contacted their pharmacy? Yes.   Pharmacy asked that she get another script sent giving the instructions of how provider now wants patient to take the med.  Preferred Pharmacy (with phone number or street name):  [facility] [address] [place]), [place] - [id number] Phone: [phone]  Fax: [phone]     Has the patient been seen for an appointment in the last year OR does the patient have an upcoming appointment? Yes.    Agent: Please be advised that RX refills may take up to 3 business days. We ask that you follow-up with your pharmacy.  Patient stated provider advised her to take 2 tablets per day every other day and she has run out so pharmacist is asking for the instructions to reflect how she should take the meds going forward.

## 2022-10-08 ENCOUNTER — Other Ambulatory Visit (INDEPENDENT_AMBULATORY_CARE_PROVIDER_SITE_OTHER): Payer: 59

## 2022-10-08 DIAGNOSIS — E1169 Type 2 diabetes mellitus with other specified complication: Secondary | ICD-10-CM

## 2022-10-08 DIAGNOSIS — Z7984 Long term (current) use of oral hypoglycemic drugs: Secondary | ICD-10-CM | POA: Diagnosis not present

## 2022-10-08 DIAGNOSIS — E785 Hyperlipidemia, unspecified: Secondary | ICD-10-CM

## 2022-10-09 LAB — LIPID PANEL
Chol/HDL Ratio: 3.6 ratio (ref 0.0–4.4)
Cholesterol, Total: 140 mg/dL (ref 100–199)
HDL: 39 mg/dL — ABNORMAL LOW (ref >39)
LDL Chol Calc (NIH): 86 mg/dL (ref 0–99)
Triglycerides: 79 mg/dL (ref 0–149)
VLDL Cholesterol Cal: 15 mg/dL (ref 5–40)

## 2022-10-11 ENCOUNTER — Other Ambulatory Visit (INDEPENDENT_AMBULATORY_CARE_PROVIDER_SITE_OTHER): Payer: Self-pay | Admitting: Primary Care

## 2022-10-11 DIAGNOSIS — E782 Mixed hyperlipidemia: Secondary | ICD-10-CM

## 2022-10-11 MED ORDER — ATORVASTATIN CALCIUM 10 MG PO TABS: 10.0000 mg | ORAL_TABLET | Freq: Every day | ORAL | 1 refills | Status: DC

## 2022-10-27 ENCOUNTER — Telehealth: Payer: Self-pay | Admitting: Primary Care

## 2022-10-27 ENCOUNTER — Ambulatory Visit (INDEPENDENT_AMBULATORY_CARE_PROVIDER_SITE_OTHER): Payer: 59 | Admitting: Primary Care

## 2022-10-27 NOTE — Telephone Encounter (Signed)
Copied from CRM [id number]. Topic: General - Inquiry >> Oct 27, 2022  3:16 PM Lennox Pippins wrote: Patient called and stated she was scheduled for a PAP today but told her PCP that she had somewhere else she goes for a PAP. Patient was questioning when her next office visit needs to be scheduled, she wanted to get it scheduled as she thought today was a follow up on her medications. Per patient she has other questions too. Please advise.  [phone] Patients callback #

## 2022-10-27 NOTE — Telephone Encounter (Signed)
Please contact pt and schedule pt a follow up for medication

## 2022-10-27 NOTE — Telephone Encounter (Signed)
Left VM msg with pt

## 2022-11-05 NOTE — Telephone Encounter (Signed)
Left VM with pt. Wanted to remind them about apt.

## 2022-11-06 ENCOUNTER — Other Ambulatory Visit (INDEPENDENT_AMBULATORY_CARE_PROVIDER_SITE_OTHER): Payer: Self-pay | Admitting: Primary Care

## 2022-11-06 ENCOUNTER — Telehealth (INDEPENDENT_AMBULATORY_CARE_PROVIDER_SITE_OTHER): Payer: 59 | Admitting: Primary Care

## 2022-11-06 DIAGNOSIS — E782 Mixed hyperlipidemia: Secondary | ICD-10-CM

## 2022-11-06 DIAGNOSIS — I10 Essential (primary) hypertension: Secondary | ICD-10-CM

## 2022-11-06 NOTE — Progress Notes (Signed)
[facility]  Virtual Visit  I connected with Yvette Perez, on 11/06/2022 at 3:35 PM  video application and verified that I am speaking with the correct person using two identifiers.   Consent: I discussed the limitations, risks, security and privacy concerns of performing an evaluation and management service by telephone and the availability of in person appointments. I also discussed with the patient that there may be a patient responsible charge related to this service. The patient expressed understanding and agreed to proceed.   Location of Patient: Home   Location of Provider: [facility]   Persons participating in Telemedicine visit: Judd Gaudier,  NP   History of Present Illness: Yvette Perez is a 32 year female she is concerned with running out of medication and the pharmacy not refilling.  Discussed with her her cholesterol was unremarkable.  Used for prophylaxis she can take atorvastatin 10 mg every other day 3 months follow-up will recheck cholesterol.  Also, placed the information on the AVS.  Made aware that she does not use or understanding the AVS and would prefer not to get information being that source.  Now she understands that she is to take atorvastatin 10 mg daily confusion cleared up   Past Medical History:  Diagnosis Date   Medical history non-contributory    Renal disorder    UTI (urinary tract infection)    No Known Allergies  Current Outpatient Medications on File Prior to Visit  Medication Sig Dispense Refill   Accu-Chek Softclix Lancets lancets Use as instructed 100 each 12   amLODipine (NORVASC) 10 MG tablet Take 1 tablet by mouth once daily 30 tablet 0   Ascorbic Acid (VITAMIN C PO) Take by mouth.     atorvastatin (LIPITOR) 10 MG tablet Take 1 tablet (10 mg total) by mouth daily. 90 tablet 1   cholecalciferol (VITAMIN D3) 25 MCG (1000 UNIT) tablet Take 1,000 Units  by mouth daily.     Cyanocobalamin (VITAMIN B-12 PO) Take by mouth. (Patient not taking: Reported on 09/15/2022)     etonogestrel (NEXPLANON) 68 MG IMPL implant 1 each by Subdermal route once.     fluconazole (DIFLUCAN) 150 MG tablet Take 1 tablet (150 mg total) by mouth daily. 1 tablet 1   glucose blood (ACCU-CHEK GUIDE) test strip 1 each by Other route 2 (two) times daily. Use as instructed 100 each 12   lidocaine (XYLOCAINE) 2 % solution Use as directed 15 mLs in the mouth or throat as needed for mouth pain. 100 mL 0   lisinopril (ZESTRIL) 20 MG tablet Take 1 tablet (20 mg total) by mouth daily. 90 tablet 1   metFORMIN (GLUCOPHAGE-XR) 500 MG 24 hr tablet Take 1 tablet (500 mg total) by mouth daily with breakfast. 90 tablet 1   metroNIDAZOLE (FLAGYL) 500 MG tablet Take 1 tablet (500 mg total) by mouth 2 (two) times daily. 14 tablet 0   sorbitol 70 % solution Take 15 mLs by mouth daily as needed. 473 mL 0   No current facility-administered medications on file prior to visit.    Observations/Objective: No vital signs taken.  See HPI  Assessment and Plan: Diagnoses and all orders for this visit:  Mixed hyperlipidemia Clarification on how to take medication    Follow Up Instructions: 3 months fasting   I discussed the assessment and treatment plan with the patient. The patient was provided an opportunity to ask questions and all were  answered. The patient agreed with the plan and demonstrated an understanding of the instructions.   The patient was advised to call back or seek an in-person evaluation if the symptoms worsen or if the condition fails to improve as anticipated.     I provided 12 minutes total of non-face-to-face time during this encounter including median intraservice time, reviewing previous notes, investigations, ordering medications, medical decision making, coordinating care and patient verbalized understanding at the end of the visit.    This note has been created  with Education officer, environmental. Any transcriptional errors are unintentional.   Grayce Sessions, NP 11/06/2022, 3:35 PM

## 2022-12-01 ENCOUNTER — Ambulatory Visit: Payer: 59 | Admitting: Registered"

## 2023-01-05 IMAGING — CT CT RENAL STONE PROTOCOL
2 of 4 series · 16 of 46 positions shown, 18 images · non-contrast
Comparison: September 02, 2018.

CLINICAL DATA: Acute right-sided flank pain.

EXAM:
CT ABDOMEN AND PELVIS WITHOUT CONTRAST
TECHNIQUE: Multidetector CT imaging of the abdomen and pelvis was performed
following the standard protocol without IV contrast.

[Series 2: axial st · axial · 0.98mm/px · z∈[+944,+1359]mm · 13 of 93 slices shown, 15 images]
[im 5/93  soft-tissue]
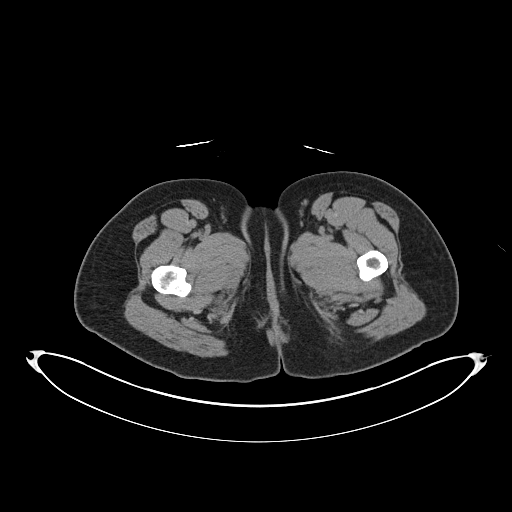
[im 5/93  bone]
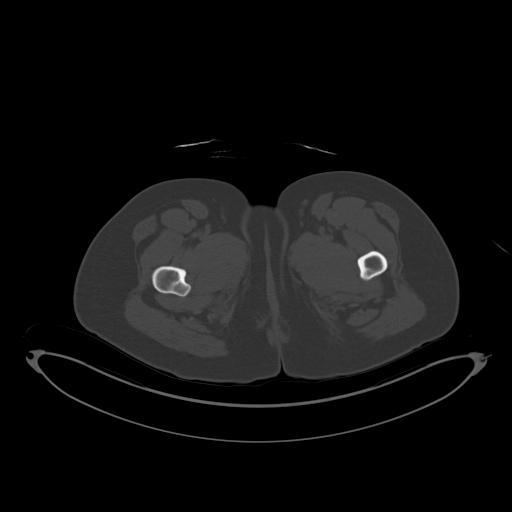
[im 14/93  soft-tissue]
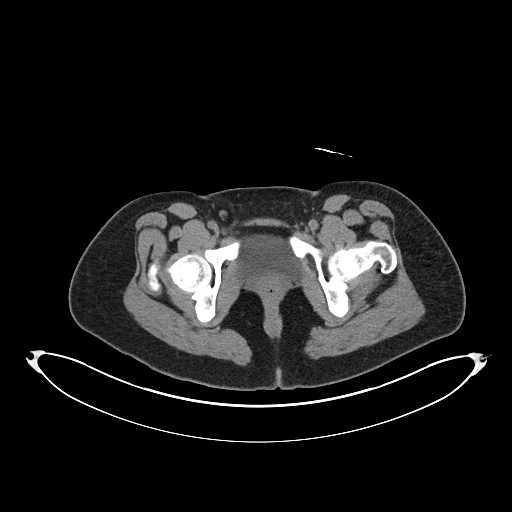
[im 19/93  soft-tissue]
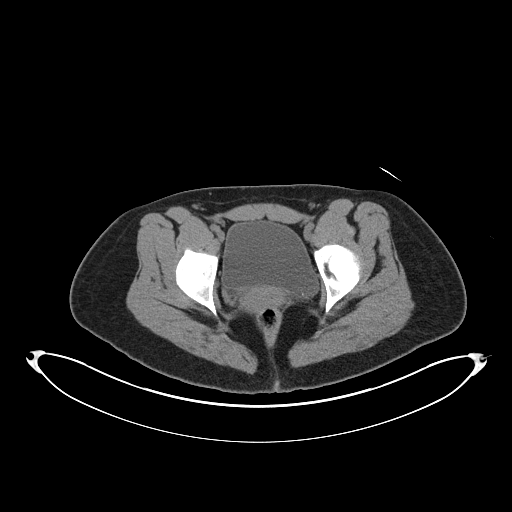
[im 28/93  soft-tissue]
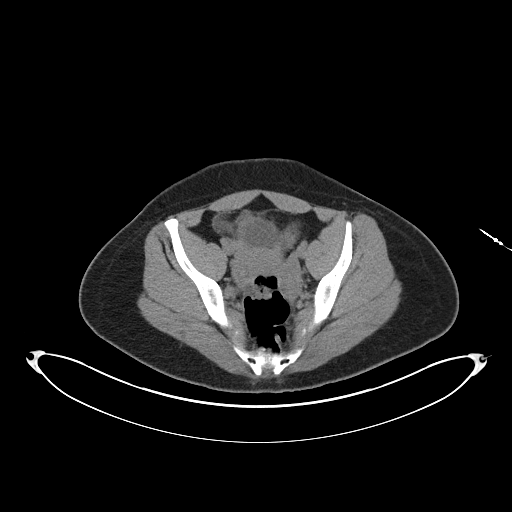
[im 33/93  soft-tissue]
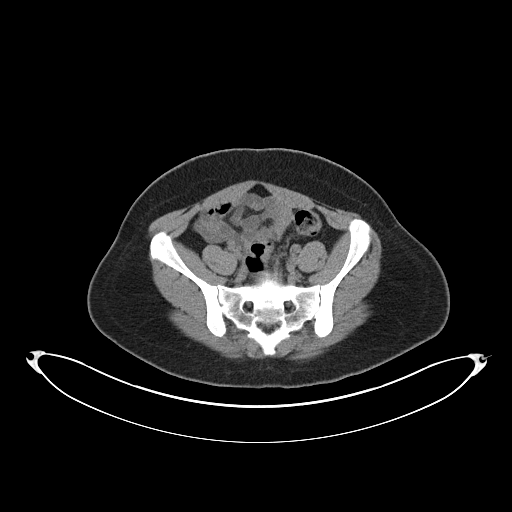
[im 42/93  soft-tissue]
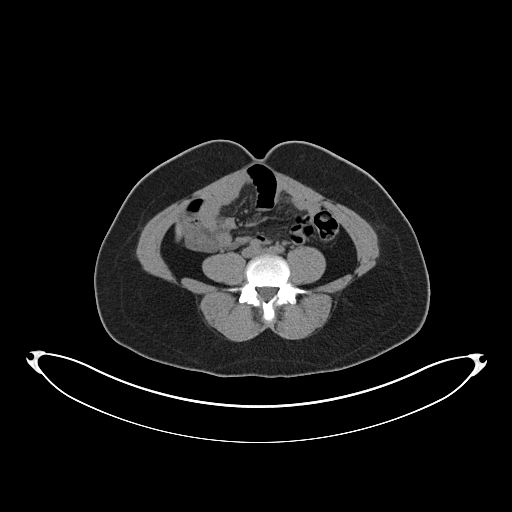
[im 47/93  soft-tissue]
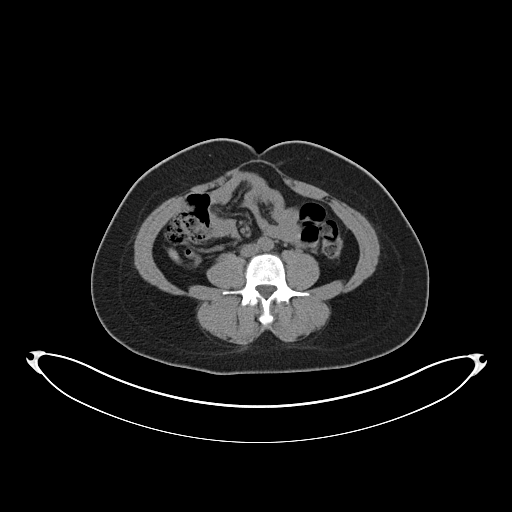
[im 51/93  soft-tissue]
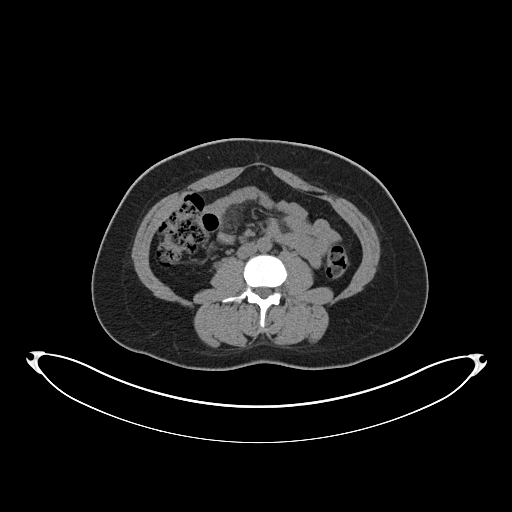
[im 60/93  soft-tissue]
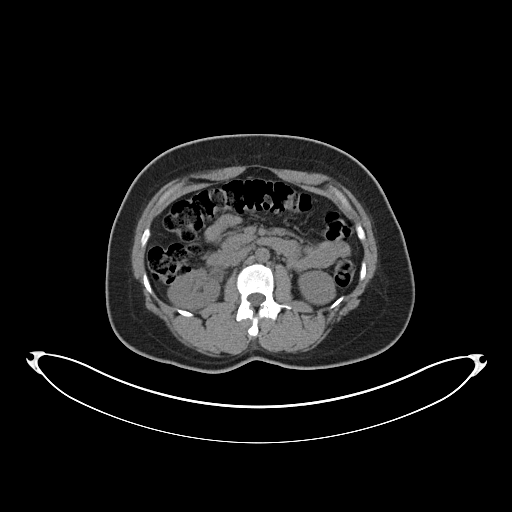
[im 60/93  bone]
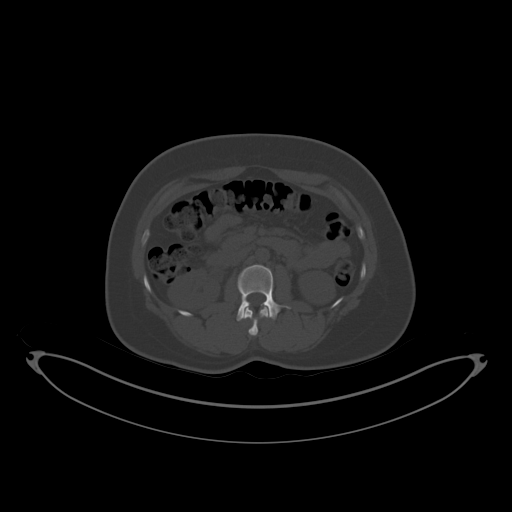
[im 65/93  soft-tissue]
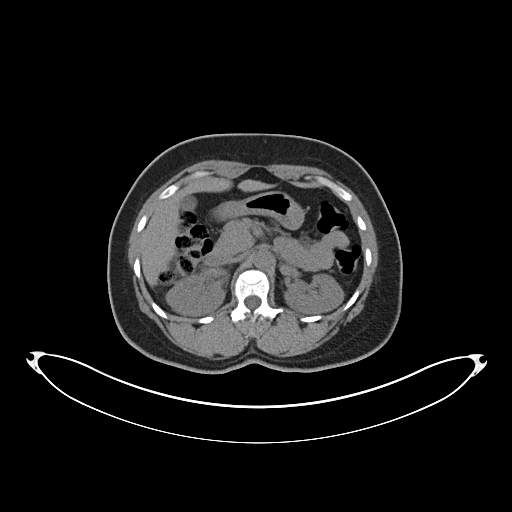
[im 74/93  soft-tissue]
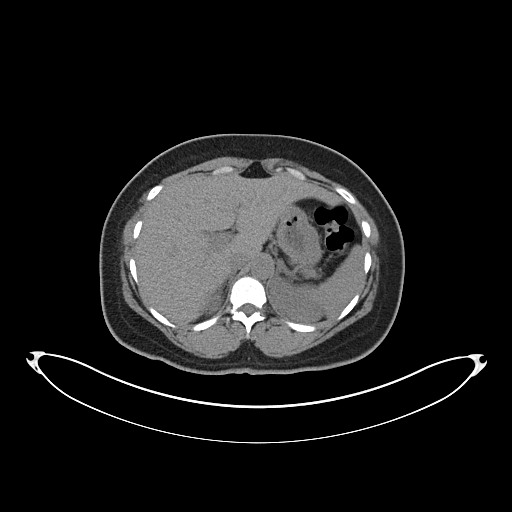
[im 79/93  soft-tissue]
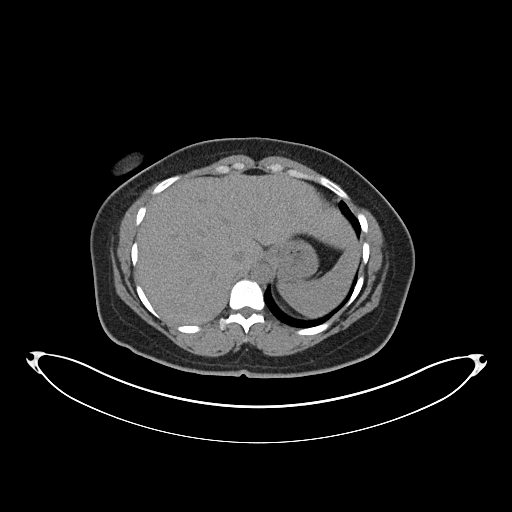
[im 88/93  soft-tissue]
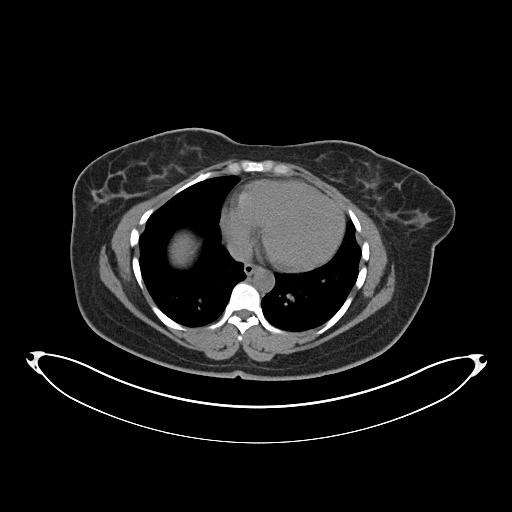

[Series 5: coronal · coronal · 0.84mm/px · 3 of 140 slices shown]
[im 47/140  soft-tissue]
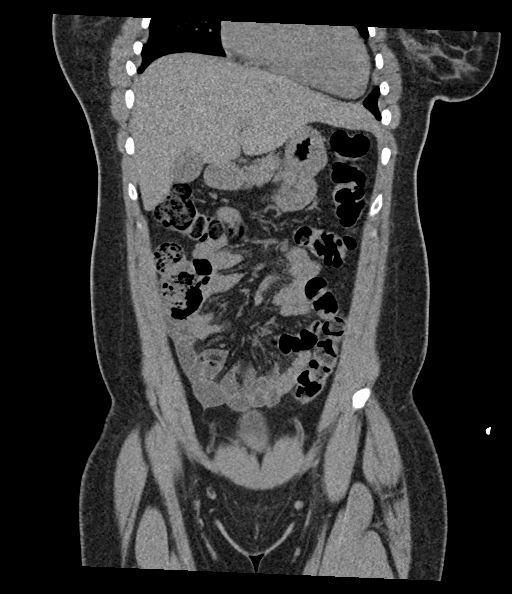
[im 62/140  soft-tissue]
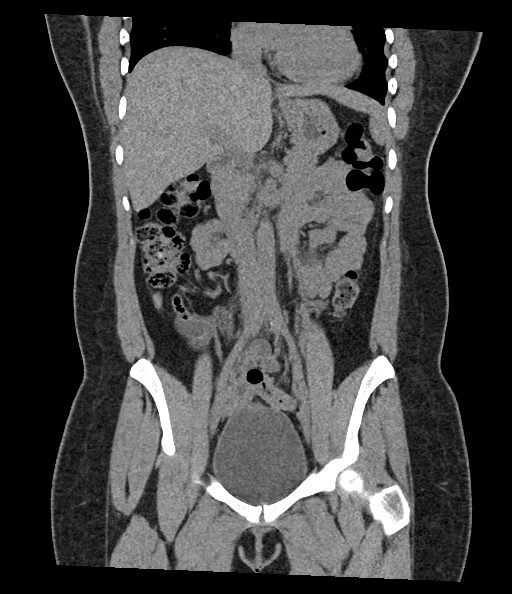
[im 78/140  soft-tissue]
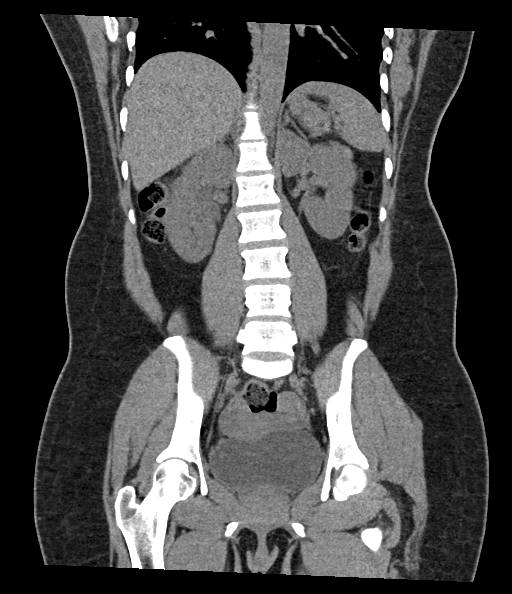

[16 of 46 positions shown; findings below may reference images not displayed]

FINDINGS: Lower chest: No acute abnormality.

Hepatobiliary: No focal liver abnormality is seen. No gallstones,
gallbladder wall thickening, or biliary dilatation.

Pancreas: Unremarkable. No pancreatic ductal dilatation or
surrounding inflammatory changes.

Spleen: Normal in size without focal abnormality.

Adrenals/Urinary Tract: Adrenal glands appear normal. Bilateral
nonobstructive nephrolithiasis is noted. Mild proximal right
ureteral dilatation is noted without definite evidence of
obstructive calculus. Small calculus is noted in dependent portion
of urinary bladder.

Stomach/Bowel: Stomach is within normal limits. Appendix appears
normal. No evidence of bowel wall thickening, distention, or
inflammatory changes.

Vascular/Lymphatic: No significant vascular findings are present. No
enlarged abdominal or pelvic lymph nodes.

Reproductive: Uterus and bilateral adnexa are unremarkable.

Other: No abdominal wall hernia or abnormality. No abdominopelvic
ascites.

Musculoskeletal: No acute or significant osseous findings.
IMPRESSION: Bilateral nonobstructive nephrolithiasis. Mild proximal right
ureteral dilatation is noted without definite evidence of ureteral
calculus, although small calculus is noted in dependent portion of
urinary bladder. Potentially this may represent recently passed
stone.

## 2023-02-04 ENCOUNTER — Other Ambulatory Visit (INDEPENDENT_AMBULATORY_CARE_PROVIDER_SITE_OTHER): Payer: Self-pay | Admitting: Primary Care

## 2023-02-04 DIAGNOSIS — E119 Type 2 diabetes mellitus without complications: Secondary | ICD-10-CM

## 2023-02-05 NOTE — Telephone Encounter (Signed)
Requested Prescriptions  Pending Prescriptions Disp Refills   metFORMIN (GLUCOPHAGE-XR) 500 MG 24 hr tablet [Pharmacy Med Name: metFORMIN HCl ER 500 MG Oral Tablet Extended Release 24 Hour] 90 tablet 0    Sig: Take 1 tablet by mouth once daily with breakfast     Endocrinology:  Diabetes - Biguanides Failed - 02/04/2023  5:10 PM      Failed - B12 Level in normal range and within 720 days    No results found for: "VITAMINB12"       Passed - Cr in normal range and within 360 days    Creatinine, Ser  Date Value Ref Range Status  04/07/2022 0.76 0.57 - 1.00 mg/dL Final         Passed - HBA1C is between 0 and 7.9 and within 180 days    HbA1c, POC (controlled diabetic range)  Date Value Ref Range Status  08/08/2022 6.5 0.0 - 7.0 % Final         Passed - eGFR in normal range and within 360 days    GFR calc Af Amer  Date Value Ref Range Status  09/02/2018 >60 >60 mL/min Final   GFR, Estimated  Date Value Ref Range Status  03/02/2022 >60 >60 mL/min Final    Comment:    (NOTE) Calculated using the CKD-EPI Creatinine Equation (2021)    eGFR  Date Value Ref Range Status  04/07/2022 109 >59 mL/min/1.73 Final         Passed - Valid encounter within last 6 months    Recent Outpatient Visits           3 months ago Mixed hyperlipidemia   [place] [facility] Grayce Sessions, NP   5 months ago Dysuria   [place] [facility] Grayce Sessions, NP   6 months ago New onset type 2 diabetes mellitus (HCC)   [place] [facility] Grayce Sessions, NP              Passed - CBC within normal limits and completed in the last 12 months    WBC  Date Value Ref Range Status  03/02/2022 8.6 4.0 - 10.5 K/uL Final   RBC  Date Value Ref Range Status  03/02/2022 5.12 (H) 3.87 - 5.11 MIL/uL Final   Hemoglobin  Date Value Ref Range Status  03/02/2022 13.4 12.0 - 15.0 g/dL Final   HCT  Date Value Ref Range Status   03/02/2022 41.2 36.0 - 46.0 % Final   MCHC  Date Value Ref Range Status  03/02/2022 32.5 30.0 - 36.0 g/dL Final   [facility]  Date Value Ref Range Status  03/02/2022 26.2 26.0 - 34.0 pg Final   MCV  Date Value Ref Range Status  03/02/2022 80.5 80.0 - 100.0 fL Final   No results found for: "PLTCOUNTKUC", "LABPLAT", "POCPLA" RDW  Date Value Ref Range Status  03/02/2022 13.6 11.5 - 15.5 % Final

## 2023-03-06 ENCOUNTER — Telehealth (INDEPENDENT_AMBULATORY_CARE_PROVIDER_SITE_OTHER): Payer: Self-pay | Admitting: Primary Care

## 2023-03-06 NOTE — Telephone Encounter (Signed)
 Called to remind pt about upcoming apt. Pt will be present

## 2023-03-09 ENCOUNTER — Ambulatory Visit (INDEPENDENT_AMBULATORY_CARE_PROVIDER_SITE_OTHER): Payer: Self-pay | Admitting: Primary Care

## 2023-03-09 VITALS — BP 121/80 | HR 99 | Resp 16 | Wt 146.8 lb

## 2023-03-09 DIAGNOSIS — Z113 Encounter for screening for infections with a predominantly sexual mode of transmission: Secondary | ICD-10-CM

## 2023-03-09 DIAGNOSIS — Z5941 Food insecurity: Secondary | ICD-10-CM

## 2023-03-09 DIAGNOSIS — Z124 Encounter for screening for malignant neoplasm of cervix: Secondary | ICD-10-CM

## 2023-03-10 ENCOUNTER — Encounter (INDEPENDENT_AMBULATORY_CARE_PROVIDER_SITE_OTHER): Payer: Self-pay | Admitting: Primary Care

## 2023-03-10 ENCOUNTER — Other Ambulatory Visit (HOSPITAL_COMMUNITY)
Admission: RE | Admit: 2023-03-10 | Discharge: 2023-03-10 | Disposition: A | Discharge status: Home / Self Care | Payer: Self-pay | Source: Ambulatory Visit | Attending: Primary Care | Admitting: Primary Care

## 2023-03-10 DIAGNOSIS — Z113 Encounter for screening for infections with a predominantly sexual mode of transmission: Secondary | ICD-10-CM | POA: Insufficient documentation

## 2023-03-10 DIAGNOSIS — Z124 Encounter for screening for malignant neoplasm of cervix: Secondary | ICD-10-CM | POA: Insufficient documentation

## 2023-03-10 NOTE — Progress Notes (Signed)
  [facility]  WELL-WOMAN PHYSICAL & PAP Patient name: Yvette Perez MRN [id number]  Date of birth: 1990-07-14 Chief Complaint:   Gynecologic Exam  History of Present Illness:   Yvette Perez is a 32 y.o. G76P1001 female being seen today for a routine well-woman exam.   CC:gyn    The current method of family planning is Nexplanon.  No LMP recorded. Patient has had an implant.   . Family h/o breast cancer: No  Family h/o colorectal cancer: No  Health Maintenance  Topic Date Due   Complete foot exam   Never done   Eye exam for diabetics  Never done   DTaP/Tdap/Td vaccine (1 - Tdap) Never done   Flu Shot  Never done   COVID-19 Vaccine (1 - 2024-25 season) Never done   Pap with HPV screening  Never done   Hemoglobin A1C  02/08/2023   Yearly kidney function blood test for diabetes  04/08/2023   Yearly kidney health urinalysis for diabetes  04/16/2023   Hepatitis C Screening  Completed   HIV Screening  Completed   HPV Vaccine  Aged Out   Review of Systems:    Denies any headaches, blurred vision, fatigue, shortness of breath, chest pain, abdominal pain, abnormal vaginal discharge/itching/odor/irritation, problems with periods, bowel movements, urination, or intercourse unless otherwise stated above.  Pertinent History Reviewed:   Reviewed past medical,surgical, social and family history.  Reviewed problem list, medications and allergies.  Physical Assessment:   Vitals:   03/09/23 1605  BP: 121/80  Pulse: 99  Resp: 16  SpO2: 100%  Weight: 146 lb 12.8 oz (66.6 kg)  Body mass index is 26.85 kg/m.        Physical Examination:  General appearance - well appearing, and in no distress Mental status - alert, oriented to person, place, and time Psych:  She has a normal mood and affect Skin - warm and dry, normal color, no suspicious lesions noted Chest - effort normal, all lung fields clear to auscultation bilaterally Heart - normal rate and regular  rhythm Neck:  midline trachea, no thyromegaly or nodules Breasts - breasts appear normal, no suspicious masses, no skin or nipple changes or axillary nodes Educated patient on proper self breast examination and had patient to demonstrate SBE. Abdomen - soft, nontender, nondistended, no masses or organomegaly Pelvic-VULVA: normal appearing vulva with no masses, tenderness or lesions   VAGINA: normal appearing vagina with normal color and discharge, no lesions   CERVIX: normal appearing cervix without discharge or lesions, no CMT UTERUS: uterus is felt to be normal size, shape, consistency and nontender  ADNEXA: No adnexal masses or tenderness noted. Extremities:  No swelling or varicosities noted  No results found for this or any previous visit (from the past 24 hours).   Assessment & Plan:  Yvette Perez was seen today for gynecologic exam.  Diagnoses and all orders for this visit:  Cervical cancer screening -     Cytology - PAP  Screening for STD (sexually transmitted disease) -     Cervicovaginal ancillary only  Food insecurity -     AMB Referral [facility] Care Management    Follow-up: Return for annual physical.  This note has been created with Education officer, environmental. Any transcriptional errors are unintentional.   Grayce Sessions, NP 03/10/2023, 2:04 PM

## 2023-03-11 ENCOUNTER — Ambulatory Visit: Payer: Self-pay | Admitting: Licensed Clinical Social Worker

## 2023-03-11 ENCOUNTER — Other Ambulatory Visit (INDEPENDENT_AMBULATORY_CARE_PROVIDER_SITE_OTHER): Payer: Self-pay | Admitting: Primary Care

## 2023-03-11 ENCOUNTER — Telehealth: Payer: Self-pay | Admitting: *Deleted

## 2023-03-11 DIAGNOSIS — N760 Acute vaginitis: Secondary | ICD-10-CM

## 2023-03-11 LAB — CERVICOVAGINAL ANCILLARY ONLY
Bacterial Vaginitis (gardnerella): POSITIVE — AB
Candida Glabrata: NEGATIVE
Candida Vaginitis: NEGATIVE
Chlamydia: NEGATIVE
Comment: NEGATIVE
Comment: NEGATIVE
Comment: NEGATIVE
Comment: NEGATIVE
Comment: NEGATIVE
Comment: NORMAL
Neisseria Gonorrhea: NEGATIVE
Trichomonas: NEGATIVE

## 2023-03-11 MED ORDER — METRONIDAZOLE 500 MG PO TABS: 500.0000 mg | ORAL_TABLET | Freq: Two times a day (BID) | ORAL | 0 refills | Status: DC

## 2023-03-11 NOTE — Patient Outreach (Signed)
  Care Coordination   Initial Visit Note   03/11/2023 Name: Yvette Perez MRN: [id number] DOB: 23-Sep-1990  Yvette Perez is a 32 y.o. year old female who sees Grayce Sessions, NP for primary care. I spoke with  Warrick Parisian by phone today.  What matters to the patients health and wellness today?  Pt informed of food and community resources.     Goals Addressed             This Visit's Progress    Care Coordination       Activities and task to complete in order to accomplish goals.   Follow up on food resources discussed (advise pt [employer] provides fresh produce and food boxes Thursday at 11am) Pt inquired about [facility]. Pt informed [facility] is accepting new patients has a on site food pantry and various resources.           SDOH assessments and interventions completed:  Yes  SDOH Interventions Today    Flowsheet Row Most Recent Value  SDOH Interventions   Food Insecurity Interventions Community Resources Provided  [Informed pt of [employer] Food pantry and schedule]  Housing Interventions --  [Pt advise no concerns with stable housing]  Utilities Interventions Intervention Not Indicated        Care Coordination Interventions:  Yes, provided  Interventions Today    Flowsheet Row Most Recent Value  Chronic Disease   Chronic disease during today's visit Diabetes  General Interventions   General Interventions Discussed/Reviewed Community Resources  [place] pt of [employer] and [employer]. Pt aware [place] food pantry is for [employer] for Women Patients.]  Education Interventions   Education Provided Provided Education  Provided Verbal Education On [employer] pt [facility] is accepting pt and have on site food resources.]  Pharmacy Interventions   Pharmacy Dicussed/Reviewed Affording Medications  [Pt advise no concerns with affordind diabetic supplies and medication]       Follow up plan: No further intervention required.    Encounter Outcome:  Patient Visit Completed   Gwyndolyn Saxon MSW, LCSW Licensed Clinical Social Worker  [facility], Population Health Direct Dial: [phone]  Fax: [phone]

## 2023-03-11 NOTE — Patient Instructions (Signed)
Visit Information  Thank you for taking time to visit with me today. Please don't hesitate to contact me if I can be of assistance to you.   Following are the goals we discussed today:   Goals Addressed             This Visit's Progress    Care Coordination       Activities and task to complete in order to accomplish goals.   Follow up on food resources discussed (advise pt [employer] provides fresh produce and food boxes Thursday at 11am) Pt inquired about [facility]. Pt informed [facility] is accepting new patients has a on site food pantry and various resources.             Please call the care guide team at [phone] if you need to cancel or reschedule your appointment.   If you are experiencing a Mental Health or Behavioral Health Crisis or need someone to talk to, please go to [facility] [address] [place] [phone]) call 911  Patient verbalizes understanding of instructions and care plan provided today and agrees to view in MyChart. Active MyChart status and patient understanding of how to access instructions and care plan via MyChart confirmed with patient.     No further action required.   Gwyndolyn Saxon MSW, LCSW Licensed Clinical Social Worker  [facility], Population Health Direct Dial: [phone]  Fax: [phone]

## 2023-03-11 NOTE — Progress Notes (Signed)
Complex Care Management Note   03/11/2023 Name: Yvette Perez MRN: [id number] DOB: 1990/04/21  Yvette Perez is a 32 y.o. year old female who sees Grayce Sessions, NP for primary care. I reached out to Warrick Parisian by phone today to offer complex care management services.  Yvette Perez was given information about Complex Care Management services today including:   The Complex Care Management services include support from the care team which includes your Nurse Coordinator, Clinical Social Worker, or Pharmacist.  The Complex Care Management team is here to help remove barriers to the health concerns and goals most important to you. Complex Care Management services are voluntary, and the patient may decline or stop services at any time by request to their care team member.   Complex Care Management Consent Status: Patient agreed to services and verbal consent obtained.   Follow up plan:  Telephone appointment with complex care management team member scheduled for:  03/11/23  Encounter Outcome:  Patient Scheduled  [facility] Coordination Care Guide  Direct Dial: [phone]

## 2023-03-12 ENCOUNTER — Encounter (INDEPENDENT_AMBULATORY_CARE_PROVIDER_SITE_OTHER): Payer: Self-pay | Admitting: Primary Care

## 2023-03-12 LAB — CYTOLOGY - PAP
Comment: NEGATIVE
Comment: NEGATIVE
Comment: NEGATIVE
Diagnosis: NEGATIVE
HPV 16: NEGATIVE
HPV 18 / 45: NEGATIVE
High risk HPV: POSITIVE — AB

## 2023-03-13 ENCOUNTER — Telehealth (INDEPENDENT_AMBULATORY_CARE_PROVIDER_SITE_OTHER): Payer: Self-pay | Admitting: Primary Care

## 2023-03-13 ENCOUNTER — Encounter (INDEPENDENT_AMBULATORY_CARE_PROVIDER_SITE_OTHER): Payer: Self-pay | Admitting: Primary Care

## 2023-03-13 ENCOUNTER — Telehealth (INDEPENDENT_AMBULATORY_CARE_PROVIDER_SITE_OTHER): Payer: Self-pay | Admitting: *Deleted

## 2023-03-13 NOTE — Telephone Encounter (Signed)
Pt. Requesting to speak with PCP about HPV seen in her results. She saw the My Chart message, but has more questions. Please advise pt.

## 2023-03-13 NOTE — Telephone Encounter (Signed)
Will forward to provider  

## 2023-03-13 NOTE — Telephone Encounter (Signed)
Patient has questions for the provider about her lab results. Please f/u with patient

## 2023-03-13 NOTE — Telephone Encounter (Signed)
  Patient called regarding her pap results- normal result with negative specific HPV testing. Patient states she does have some discomfort with IC and some spotting afterward at times- she will follow up with her GYN for this.    You have bacterial vaginosis A prescription for metronidazole. You take this medication twice a day for 7 days. Be sure that you do not drink alcohol when you take this medication because the combination can give you severe nausea and vomiting. It can sometimes give people a metallic taste in their mouth while they are taking it. When you take antibiotics, it can wipe out your gut flora. This can cause problems like diarrhea.

## 2023-03-15 ENCOUNTER — Encounter (INDEPENDENT_AMBULATORY_CARE_PROVIDER_SITE_OTHER): Payer: Self-pay | Admitting: Primary Care

## 2023-03-27 ENCOUNTER — Other Ambulatory Visit (INDEPENDENT_AMBULATORY_CARE_PROVIDER_SITE_OTHER): Payer: Self-pay | Admitting: Primary Care

## 2023-03-27 DIAGNOSIS — I10 Essential (primary) hypertension: Secondary | ICD-10-CM

## 2023-03-30 NOTE — Telephone Encounter (Signed)
 Last picked up on 11/06/22 for a 90 day supply. Will forward to provider for refill

## 2023-04-03 ENCOUNTER — Telehealth: Payer: Self-pay | Admitting: Primary Care

## 2023-04-03 NOTE — Telephone Encounter (Signed)
 Copied from CRM [id number]. Topic: General - Other >> Apr 03, 2023  3:35 PM Everette C wrote: Reason for CRM: The patient has been directed by their pharmacy to contact their PCP and request that a member of staff follow up with them regarding their refill request of lisinopril  (ZESTRIL ) 20 MG tablet [[id number]]55  The patient has been told that the cost of the medication will be roughly $52 and they would like to discuss cost effective alternatives when available   Please contact the patient further when possible

## 2023-04-06 NOTE — Telephone Encounter (Signed)
 Will forward to provider

## 2023-04-07 ENCOUNTER — Other Ambulatory Visit (INDEPENDENT_AMBULATORY_CARE_PROVIDER_SITE_OTHER): Payer: Self-pay | Admitting: Primary Care

## 2023-04-07 DIAGNOSIS — I10 Essential (primary) hypertension: Secondary | ICD-10-CM

## 2023-04-07 MED ORDER — LISINOPRIL 20 MG PO TABS: 20.0000 mg | ORAL_TABLET | Freq: Every day | ORAL | 1 refills | Status: DC

## 2023-05-18 ENCOUNTER — Other Ambulatory Visit (INDEPENDENT_AMBULATORY_CARE_PROVIDER_SITE_OTHER): Payer: Self-pay | Admitting: Primary Care

## 2023-05-18 DIAGNOSIS — I10 Essential (primary) hypertension: Secondary | ICD-10-CM

## 2023-05-18 NOTE — Telephone Encounter (Signed)
 Will forward to provider

## 2023-05-30 ENCOUNTER — Telehealth: Payer: Self-pay | Admitting: Nurse Practitioner

## 2023-05-30 DIAGNOSIS — B3731 Acute candidiasis of vulva and vagina: Secondary | ICD-10-CM

## 2023-05-30 MED ORDER — FLUCONAZOLE 150 MG PO TABS: 150.0000 mg | ORAL_TABLET | Freq: Every day | ORAL | 1 refills | Status: DC

## 2023-05-30 NOTE — Progress Notes (Signed)
 Virtual Visit Consent   Yvette Perez, you are scheduled for a virtual visit with a [facility] provider today. Just as with appointments in the office, your consent must be obtained to participate. Your consent will be active for this visit and any virtual visit you may have with one of our providers in the next 365 days. If you have a MyChart account, a copy of this consent can be sent to you electronically.  As this is a virtual visit, video technology does not allow for your provider to perform a traditional examination. This may limit your provider's ability to fully assess your condition. If your provider identifies any concerns that need to be evaluated in person or the need to arrange testing (such as labs, EKG, etc.), we will make arrangements to do so. Although advances in technology are sophisticated, we cannot ensure that it will always work on either your end or our end. If the connection with a video visit is poor, the visit may have to be switched to a telephone visit. With either a video or telephone visit, we are not always able to ensure that we have a secure connection.  By engaging in this virtual visit, you consent to the provision of healthcare and authorize for your insurance to be billed (if applicable) for the services provided during this visit. Depending on your insurance coverage, you may receive a charge related to this service.  I need to obtain your verbal consent now. Are you willing to proceed with your visit today? Chyrl Elwell has provided verbal consent on 05/30/2023 for a virtual visit (video or telephone). Claiborne Rigg, NP  Date: 05/30/2023 8:48 AM   Virtual Visit via Video Note   I, Claiborne Rigg, connected with  Devika Dragovich  ([id number], [birth year]) on 05/30/23 at  8:45 AM EST by a video-enabled telemedicine application and verified that I am speaking with the correct person using two identifiers.  Location: Patient: Virtual Visit Location Patient:  Home Provider: Virtual Visit Location Provider: Home Office   I discussed the limitations of evaluation and management by telemedicine and the availability of in person appointments. The patient expressed understanding and agreed to proceed.    History of Present Illness: Yvette Perez is a 33 y.o. who identifies as a female who was assigned female at birth, and is being seen today for yeast vaginitis.  Ms. Benn states she has been experiencing vaginal itching and thick discharge with no odor over the past few days. She has had yeast infections in the past and states current symptoms are similar.    Problems:  Patient Active Problem List   Diagnosis Date Noted   New onset type 2 diabetes mellitus (HCC) 04/16/2022   Essential hypertension 04/07/2022   SVD (spontaneous vaginal delivery) 09/11/2012   Postpartum care following vaginal delivery (6/21) 09/11/2012    Allergies: No Known Allergies Medications:  Current Outpatient Medications:    fluconazole (DIFLUCAN) 150 MG tablet, Take 1 tablet (150 mg total) by mouth daily., Disp: 1 tablet, Rfl: 1   Accu-Chek Softclix Lancets lancets, Use as instructed, Disp: 100 each, Rfl: 12   amLODipine (NORVASC) 10 MG tablet, Take 1 tablet by mouth once daily, Disp: 90 tablet, Rfl: 0   Ascorbic Acid (VITAMIN C PO), Take by mouth., Disp: , Rfl:    atorvastatin (LIPITOR) 10 MG tablet, Take 1 tablet (10 mg total) by mouth daily., Disp: 90 tablet, Rfl: 1   cholecalciferol (VITAMIN D3) 25 MCG (1000 UNIT) tablet, Take  1,000 Units by mouth daily., Disp: , Rfl:    Cyanocobalamin (VITAMIN B-12 PO), Take by mouth. (Patient not taking: Reported on 09/15/2022), Disp: , Rfl:    etonogestrel (NEXPLANON) 68 MG IMPL implant, 1 each by Subdermal route once., Disp: , Rfl:    glucose blood (ACCU-CHEK GUIDE) test strip, 1 each by Other route 2 (two) times daily. Use as instructed, Disp: 100 each, Rfl: 12   lidocaine (XYLOCAINE) 2 % solution, Use as directed 15 mLs in the  mouth or throat as needed for mouth pain., Disp: 100 mL, Rfl: 0   lisinopril (ZESTRIL) 20 MG tablet, Take 1 tablet (20 mg total) by mouth daily., Disp: 90 tablet, Rfl: 1   metFORMIN (GLUCOPHAGE-XR) 500 MG 24 hr tablet, Take 1 tablet by mouth once daily with breakfast, Disp: 90 tablet, Rfl: 0   metroNIDAZOLE (FLAGYL) 500 MG tablet, Take 1 tablet (500 mg total) by mouth 2 (two) times daily., Disp: 14 tablet, Rfl: 0   sorbitol 70 % solution, Take 15 mLs by mouth daily as needed., Disp: 473 mL, Rfl: 0  Observations/Objective: Patient is well-developed, well-nourished in no acute distress.  Resting comfortably at home.  Head is normocephalic, atraumatic.  No labored breathing.  Speech is clear and coherent with logical content.  Patient is alert and oriented at baseline.    Assessment and Plan: 1. Yeast vaginitis (Primary) - fluconazole (DIFLUCAN) 150 MG tablet; Take 1 tablet (150 mg total) by mouth daily.  Dispense: 1 tablet; Refill: 1   Follow Up Instructions: I discussed the assessment and treatment plan with the patient. The patient was provided an opportunity to ask questions and all were answered. The patient agreed with the plan and demonstrated an understanding of the instructions.  A copy of instructions were sent to the patient via MyChart unless otherwise noted below.    The patient was advised to call back or seek an in-person evaluation if the symptoms worsen or if the condition fails to improve as anticipated.    Claiborne Rigg, NP

## 2023-05-30 NOTE — Patient Instructions (Signed)
 Yvette Perez, thank you for joining Claiborne Rigg, NP for today's virtual visit.  While this provider is not your primary care provider (PCP), if your PCP is located in our provider database this encounter information will be shared with them immediately following your visit.   A [facility] MyChart account gives you access to today's visit and all your visits, tests, and labs performed at [facility] " click here if you don't have a [facility] MyChart account or go to [email or web address]  Consent: (Patient) Yvette Perez provided verbal consent for this virtual visit at the beginning of the encounter.  Current Medications:  Current Outpatient Medications:    fluconazole (DIFLUCAN) 150 MG tablet, Take 1 tablet (150 mg total) by mouth daily., Disp: 1 tablet, Rfl: 1   Accu-Chek Softclix Lancets lancets, Use as instructed, Disp: 100 each, Rfl: 12   amLODipine (NORVASC) 10 MG tablet, Take 1 tablet by mouth once daily, Disp: 90 tablet, Rfl: 0   Ascorbic Acid (VITAMIN C PO), Take by mouth., Disp: , Rfl:    atorvastatin (LIPITOR) 10 MG tablet, Take 1 tablet (10 mg total) by mouth daily., Disp: 90 tablet, Rfl: 1   cholecalciferol (VITAMIN D3) 25 MCG (1000 UNIT) tablet, Take 1,000 Units by mouth daily., Disp: , Rfl:    Cyanocobalamin (VITAMIN B-12 PO), Take by mouth. (Patient not taking: Reported on 09/15/2022), Disp: , Rfl:    etonogestrel (NEXPLANON) 68 MG IMPL implant, 1 each by Subdermal route once., Disp: , Rfl:    glucose blood (ACCU-CHEK GUIDE) test strip, 1 each by Other route 2 (two) times daily. Use as instructed, Disp: 100 each, Rfl: 12   lidocaine (XYLOCAINE) 2 % solution, Use as directed 15 mLs in the mouth or throat as needed for mouth pain., Disp: 100 mL, Rfl: 0   lisinopril (ZESTRIL) 20 MG tablet, Take 1 tablet (20 mg total) by mouth daily., Disp: 90 tablet, Rfl: 1   metFORMIN (GLUCOPHAGE-XR) 500 MG 24 hr tablet, Take 1 tablet by mouth once daily with breakfast, Disp:  90 tablet, Rfl: 0   metroNIDAZOLE (FLAGYL) 500 MG tablet, Take 1 tablet (500 mg total) by mouth 2 (two) times daily., Disp: 14 tablet, Rfl: 0   sorbitol 70 % solution, Take 15 mLs by mouth daily as needed., Disp: 473 mL, Rfl: 0   Medications ordered in this encounter:  Meds ordered this encounter  Medications   fluconazole (DIFLUCAN) 150 MG tablet    Sig: Take 1 tablet (150 mg total) by mouth daily.    Dispense:  1 tablet    Refill:  1    Supervising Provider:   Merrilee Jansky [id number]     *If you need refills on other medications prior to your next appointment, please contact your pharmacy*  Follow-Up: Call back or seek an in-person evaluation if the symptoms worsen or if the condition fails to improve as anticipated.  [facility] [phone]    If you have been instructed to have an in-person evaluation today at a local Urgent Care facility, please use the link below. It will take you to a list of all of our available [facility], including address, phone number and hours of operation. Please do not delay care.  [facility]  If you or a family member do not have a primary care provider, use the link below to schedule a visit and establish care. When you choose a [facility] primary care physician or advanced  practice provider, you gain a long-term partner in health. Find a Primary Care Provider  Learn more about [facility]'s in-office and virtual care options: [place] - Get Care Now

## 2023-06-04 ENCOUNTER — Other Ambulatory Visit: Payer: Self-pay

## 2023-06-04 ENCOUNTER — Ambulatory Visit: Payer: Self-pay | Admitting: Obstetrics and Gynecology

## 2023-06-04 VITALS — BP 122/87 | HR 97 | Ht 62.0 in | Wt 148.0 lb

## 2023-06-04 DIAGNOSIS — R300 Dysuria: Secondary | ICD-10-CM

## 2023-06-04 DIAGNOSIS — N939 Abnormal uterine and vaginal bleeding, unspecified: Secondary | ICD-10-CM | POA: Diagnosis not present

## 2023-06-04 DIAGNOSIS — N941 Unspecified dyspareunia: Secondary | ICD-10-CM

## 2023-06-04 NOTE — Progress Notes (Unsigned)
 NEW GYNECOLOGY PATIENT Patient name: Yvette Perez MRN [id number]  Date of birth: 06/08/1990 Chief Complaint:   new GYN (pain in left side stomach with intercourse w/ bleeding, bending over, or using the bathroom--since had son for 10 years/Nexplanon removed expired last year.)     History:  Yvette Perez is a 33 y.o. G1P1001 being seen today for dyspareunia and dysuria since delivery of her baby 10 years ago and pain is predominantly left sided.   Discussed the use of AI scribe software for clinical note transcription with the patient, who gave verbal consent to proceed.  History of Present Illness   Yvette Perez is a 33 year old female who presents with chronic pelvic pain and abnormal uterine bleeding.  She has been experiencing chronic pelvic pain for the past ten years, primarily during intercourse and urination. The pain is described as uncomfortable, localized to one side, and persists after intercourse. She has not attempted any treatments for this pain.  She reports abnormal uterine bleeding during intercourse, which has been occurring almost every time for the past ten years. Despite previous treatment for a bacterial infection, the bleeding persisted. There is no bleeding or spotting between periods, except during intercourse.  Regarding her menstrual history, she had irregular periods before starting Nexplanon. For the first three years on Nexplanon, she did not have periods, but about a year ago, she began experiencing heavy bleeding. She changes her pad approximately every hour due to saturation. The bleeding lasts about five days and occurs monthly. She does not experience pain during menstruation.  She recently had a yeast infection, for which she took medication. No current abnormal vaginal discharge. She has not had an abnormal Pap smear in the past.            Gynecologic History No LMP recorded. Patient has had an implant. Contraception:  nexplanon has been in at least  4 years Last Pap:     Component Value Date/Time   DIAGPAP  03/10/2023 1032    - Negative for intraepithelial lesion or malignancy (NILM)   HPVHIGH Positive (A) 03/10/2023 1032   ADEQPAP  03/10/2023 1032    Satisfactory for evaluation; transformation zone component PRESENT.    High Risk HPV: Positive  Adequacy:  Satisfactory for evaluation, transformation zone component PRESENT  Diagnosis:  Atypical squamous cells of undetermined significance (ASC-US)  Last Mammogram: n/a Last Colonoscopy: n/a  Obstetric History OB History  Gravida Para Term Preterm AB Living  1 1 1   1   SAB IAB Ectopic Multiple Live Births      1    # Outcome Date GA Lbr Len/2nd Weight Sex Type Anes PTL Lv  1 Term 09/11/12 [redacted]w[redacted]d 23:09 / 00:16 7 lb 1.4 oz (3.215 kg) M Vag-Spont EPI  LIV    Past Medical History:  Diagnosis Date   Medical history non-contributory    Renal disorder    UTI (urinary tract infection)     Past Surgical History:  Procedure Laterality Date   NO PAST SURGERIES      Current Outpatient Medications on File Prior to Visit  Medication Sig Dispense Refill   amLODipine (NORVASC) 10 MG tablet Take 1 tablet by mouth once daily 90 tablet 0   atorvastatin (LIPITOR) 10 MG tablet Take 1 tablet (10 mg total) by mouth daily. 90 tablet 1   etonogestrel (NEXPLANON) 68 MG IMPL implant 1 each by Subdermal route once.     lisinopril (ZESTRIL) 20 MG tablet Take 1 tablet (20  mg total) by mouth daily. 90 tablet 1   metFORMIN (GLUCOPHAGE-XR) 500 MG 24 hr tablet Take 1 tablet by mouth once daily with breakfast 90 tablet 0   Accu-Chek Softclix Lancets lancets Use as instructed (Patient not taking: Reported on 06/04/2023) 100 each 12   Ascorbic Acid (VITAMIN C PO) Take by mouth. (Patient not taking: Reported on 06/04/2023)     cholecalciferol (VITAMIN D3) 25 MCG (1000 UNIT) tablet Take 1,000 Units by mouth daily. (Patient not taking: Reported on 06/04/2023)     Cyanocobalamin (VITAMIN B-12 PO) Take by  mouth. (Patient not taking: Reported on 06/04/2023)     fluconazole (DIFLUCAN) 150 MG tablet Take 1 tablet (150 mg total) by mouth daily. (Patient not taking: Reported on 06/04/2023) 1 tablet 1   glucose blood (ACCU-CHEK GUIDE) test strip 1 each by Other route 2 (two) times daily. Use as instructed 100 each 12   lidocaine (XYLOCAINE) 2 % solution Use as directed 15 mLs in the mouth or throat as needed for mouth pain. (Patient not taking: Reported on 06/04/2023) 100 mL 0   metroNIDAZOLE (FLAGYL) 500 MG tablet Take 1 tablet (500 mg total) by mouth 2 (two) times daily. (Patient not taking: Reported on 06/04/2023) 14 tablet 0   sorbitol 70 % solution Take 15 mLs by mouth daily as needed. (Patient not taking: Reported on 06/04/2023) 473 mL 0   No current facility-administered medications on file prior to visit.    No Known Allergies  Social History:  reports that she has been smoking cigars and cigarettes. She has never used smokeless tobacco. She reports that she does not drink alcohol and does not use drugs.  No family history on file.  The following portions of the patient's history were reviewed and updated as appropriate: allergies, current medications, past family history, past medical history, past social history, past surgical history and problem list.  Review of Systems Pertinent items noted in HPI and remainder of comprehensive ROS otherwise negative.  Physical Exam:  BP 122/87 (BP Location: Right Arm, Patient Position: Sitting, Cuff Size: Normal)   Pulse 97   Ht 5\' 2"  (1.575 m)   Wt 148 lb (67.1 kg)   SpO2 98%   BMI 27.07 kg/m  Physical Exam Vitals and nursing note reviewed. Exam conducted with a chaperone present.  Constitutional:      Appearance: Normal appearance.  Pulmonary:     Effort: Pulmonary effort is normal.  Abdominal:     Palpations: Abdomen is soft.     Comments: + [place]  Genitourinary:    General: Normal vulva.     Exam position: Lithotomy position.      Comments: Normal appearing vulva Normal vulvar sensation bilaterally Nontender superficial pelvic floor muscles Nontender ischial tuberosities bilaterally  Allodynia at introitus: No  Anal wink present Posterior vaginal wall nontender Right levator ani 5/10 Right ischiococcygeous 1/10 Right obturator internus 3/10 Left levator ani 3/10 Left ischioccocygeous 4/10 Left obturator internus 5/10 Anterior vaginal wall nontender Uterine nontender Left adnexal tenderness   Neurological:     Mental Status: She is alert.        Assessment and Plan:   Assessment and Plan    Dyspareunia and Pelvic Pain Chronic dyspareunia with postcoital bleeding and pelvic floor muscle tenderness. Pelvic floor tenderness on exam - Refer to pelvic floor physical therapy for management.  Menorrhagia Heavy menstrual bleeding post-Nexplanon, possibly due to end of implant's effective period. Ultrasound needed for uterine evaluation. - Order pelvic ultrasound to evaluate  uterine lining and structure. - Order updated blood work to assess underlying issues. - Schedule Nexplanon replacement and monitor bleeding pattern post-replacement.  Follow-up Follow-up plans discussed for further evaluation and management. - Schedule ultrasound for a Saturday if possible, or next available date. - Schedule follow-up appointment after ultrasound and blood work results are available.       Routine preventative health maintenance measures emphasized. Please refer to After Visit Summary for other counseling recommendations.   Follow-up: No follow-ups on file.      Lorriane Shire, MD Obstetrician & Gynecologist, Faculty Practice [facility] for [employer], [facility]

## 2023-06-05 ENCOUNTER — Encounter: Payer: Self-pay | Admitting: Obstetrics and Gynecology

## 2023-06-05 LAB — HEMOGLOBIN A1C
Est. average glucose Bld gHb Est-mCnc: 151 mg/dL
Hgb A1c MFr Bld: 6.9 % — ABNORMAL HIGH (ref 4.8–5.6)

## 2023-06-05 LAB — TSH RFX ON ABNORMAL TO FREE T4: TSH: 1 u[IU]/mL (ref 0.450–4.500)

## 2023-06-05 LAB — FOLLICLE STIMULATING HORMONE: FSH: 4.1 m[IU]/mL

## 2023-06-11 ENCOUNTER — Ambulatory Visit (HOSPITAL_COMMUNITY)
Admission: RE | Admit: 2023-06-11 | Discharge: 2023-06-11 | Disposition: A | Discharge status: Home / Self Care | Source: Ambulatory Visit | Attending: Obstetrics and Gynecology | Admitting: Obstetrics and Gynecology

## 2023-06-11 DIAGNOSIS — N939 Abnormal uterine and vaginal bleeding, unspecified: Secondary | ICD-10-CM | POA: Diagnosis present

## 2023-06-16 ENCOUNTER — Encounter: Payer: Self-pay | Admitting: Obstetrics and Gynecology

## 2023-06-22 ENCOUNTER — Encounter: Payer: Self-pay | Admitting: Obstetrics and Gynecology

## 2023-07-05 ENCOUNTER — Encounter (INDEPENDENT_AMBULATORY_CARE_PROVIDER_SITE_OTHER): Payer: Self-pay | Admitting: Primary Care

## 2023-07-06 NOTE — Telephone Encounter (Signed)
 Will forward to provider

## 2023-07-08 ENCOUNTER — Telehealth: Admitting: Physician Assistant

## 2023-07-08 DIAGNOSIS — B3731 Acute candidiasis of vulva and vagina: Secondary | ICD-10-CM | POA: Diagnosis not present

## 2023-07-08 MED ORDER — FLUCONAZOLE 150 MG PO TABS: ORAL_TABLET | ORAL | 0 refills | Status: DC

## 2023-07-08 NOTE — Progress Notes (Signed)
 Virtual Visit Consent   [name], you are scheduled for a virtual visit with a [facility] provider today. Just as with appointments in the office, your consent must be obtained to participate. Your consent will be active for this visit and any virtual visit you may have with one of our providers in the next 365 days. If you have a MyChart account, a copy of this consent can be sent to you electronically.  As this is a virtual visit, video technology does not allow for your provider to perform a traditional examination. This may limit your provider's ability to fully assess your condition. If your provider identifies any concerns that need to be evaluated in person or the need to arrange testing (such as labs, EKG, etc.), we will make arrangements to do so. Although advances in technology are sophisticated, we cannot ensure that it will always work on either your end or our end. If the connection with a video visit is poor, the visit may have to be switched to a telephone visit. With either a video or telephone visit, we are not always able to ensure that we have a secure connection.  By engaging in this virtual visit, you consent to the provision of healthcare and authorize for your insurance to be billed (if applicable) for the services provided during this visit. Depending on your insurance coverage, you may receive a charge related to this service.  I need to obtain your verbal consent now. Are you willing to proceed with your visit today? [name] has provided verbal consent on 07/08/2023 for a virtual visit (video or telephone). [name], [place]  Date: 07/08/2023 5:02 PM   Virtual Visit via Video Note   I, [name], connected with  Yvette Perez  ([id number], October 01, 1990) on 07/08/23 at  4:45 PM EDT by a video-enabled telemedicine application and verified that I am speaking with the correct person using two identifiers.  Location: Patient: Virtual Visit Location  Patient: Home Provider: Virtual Visit Location Provider: Home Office   I discussed the limitations of evaluation and management by telemedicine and the availability of in person appointments. The patient expressed understanding and agreed to proceed.    History of Present Illness: Yvette Perez is a 33 y.o. who identifies as a female who was assigned female at birth, and is being seen today for few days of vaginal itching along with irritation and redness of the external labia.  Denies fever, chills, discharge.  Denies change to soaps or lotions.  Did recently use some A&D ointment after having a "shave bump".  Denies urinary urgency, frequency, dysuria.  Denies new sexual partner.  Had routine STI testing this morning, results pending.   HPI: HPI  Problems:  Patient Active Problem List   Diagnosis Date Noted   New onset type 2 diabetes mellitus (HCC) 04/16/2022   Essential hypertension 04/07/2022   SVD (spontaneous vaginal delivery) 09/11/2012   Postpartum care following vaginal delivery (6/21) 09/11/2012    Allergies: No Known Allergies Medications:  Current Outpatient Medications:    fluconazole (DIFLUCAN) 150 MG tablet, Take 1 tablet PO once. Repeat in 3 days if needed., Disp: 2 tablet, Rfl: 0   Accu-Chek Softclix Lancets lancets, Use as instructed (Patient not taking: Reported on 06/04/2023), Disp: 100 each, Rfl: 12   amLODipine (NORVASC) 10 MG tablet, Take 1 tablet by mouth once daily, Disp: 90 tablet, Rfl: 0   atorvastatin (LIPITOR) 10 MG tablet, Take 1 tablet (10 mg total) by mouth  daily., Disp: 90 tablet, Rfl: 1   etonogestrel (NEXPLANON) 68 MG IMPL implant, 1 each by Subdermal route once., Disp: , Rfl:    glucose blood (ACCU-CHEK GUIDE) test strip, 1 each by Other route 2 (two) times daily. Use as instructed, Disp: 100 each, Rfl: 12   lisinopril (ZESTRIL) 20 MG tablet, Take 1 tablet (20 mg total) by mouth daily., Disp: 90 tablet, Rfl: 1   metFORMIN (GLUCOPHAGE-XR) 500 MG 24 hr  tablet, Take 1 tablet by mouth once daily with breakfast, Disp: 90 tablet, Rfl: 0  Observations/Objective: Patient is well-developed, well-nourished in no acute distress.  Resting comfortably at home.  Head is normocephalic, atraumatic.  No labored breathing. Speech is clear and coherent with logical content.  Patient is alert and oriented at baseline.   Assessment and Plan: 1. Yeast vaginitis (Primary) - fluconazole (DIFLUCAN) 150 MG tablet; Take 1 tablet PO once. Repeat in 3 days if needed.  Dispense: 2 tablet; Refill: 0  Denies concern for pregnancy.  STI testing this morning, results pending.  Most consistent with yeast infection.  Will start Diflucan pending her results.  Follow Up Instructions: I discussed the assessment and treatment plan with the patient. The patient was provided an opportunity to ask questions and all were answered. The patient agreed with the plan and demonstrated an understanding of the instructions.  A copy of instructions were sent to the patient via MyChart unless otherwise noted below.   The patient was advised to call back or seek an in-person evaluation if the symptoms worsen or if the condition fails to improve as anticipated.    [name], PA-C

## 2023-07-08 NOTE — Patient Instructions (Signed)
 Yvette Perez, thank you for joining Yvette Maillard, PA-C for today's virtual visit.  While this provider is not your primary care provider (PCP), if your PCP is located in our provider database this encounter information will be shared with them immediately following your visit.   A [facility] MyChart account gives you access to today's visit and all your visits, tests, and labs performed at [facility] " click here if you don't have a [name] account or go to [email or web address]  Consent: (Patient) Yvette Perez provided verbal consent for this virtual visit at the beginning of the encounter.  Current Medications:  Current Outpatient Medications:    Accu-Chek Softclix Lancets lancets, Use as instructed (Patient not taking: Reported on 06/04/2023), Disp: 100 each, Rfl: 12   amLODipine (NORVASC) 10 MG tablet, Take 1 tablet by mouth once daily, Disp: 90 tablet, Rfl: 0   Ascorbic Acid (VITAMIN C PO), Take by mouth. (Patient not taking: Reported on 06/04/2023), Disp: , Rfl:    atorvastatin (LIPITOR) 10 MG tablet, Take 1 tablet (10 mg total) by mouth daily., Disp: 90 tablet, Rfl: 1   cholecalciferol (VITAMIN D3) 25 MCG (1000 UNIT) tablet, Take 1,000 Units by mouth daily. (Patient not taking: Reported on 06/04/2023), Disp: , Rfl:    Cyanocobalamin (VITAMIN B-12 PO), Take by mouth. (Patient not taking: Reported on 06/04/2023), Disp: , Rfl:    etonogestrel (NEXPLANON) 68 MG IMPL implant, 1 each by Subdermal route once., Disp: , Rfl:    fluconazole (DIFLUCAN) 150 MG tablet, Take 1 tablet (150 mg total) by mouth daily. (Patient not taking: Reported on 06/04/2023), Disp: 1 tablet, Rfl: 1   glucose blood (ACCU-CHEK GUIDE) test strip, 1 each by Other route 2 (two) times daily. Use as instructed, Disp: 100 each, Rfl: 12   lidocaine (XYLOCAINE) 2 % solution, Use as directed 15 mLs in the mouth or throat as needed for mouth pain. (Patient not taking: Reported on 06/04/2023), Disp:  100 mL, Rfl: 0   lisinopril (ZESTRIL) 20 MG tablet, Take 1 tablet (20 mg total) by mouth daily., Disp: 90 tablet, Rfl: 1   metFORMIN (GLUCOPHAGE-XR) 500 MG 24 hr tablet, Take 1 tablet by mouth once daily with breakfast, Disp: 90 tablet, Rfl: 0   metroNIDAZOLE (FLAGYL) 500 MG tablet, Take 1 tablet (500 mg total) by mouth 2 (two) times daily. (Patient not taking: Reported on 06/04/2023), Disp: 14 tablet, Rfl: 0   sorbitol 70 % solution, Take 15 mLs by mouth daily as needed. (Patient not taking: Reported on 06/04/2023), Disp: 473 mL, Rfl: 0   Medications ordered in this encounter:  No orders of the defined types were placed in this encounter.    *If you need refills on other medications prior to your next appointment, please contact your pharmacy*  Follow-Up: Call back or seek an in-person evaluation if the symptoms worsen or if the condition fails to improve as anticipated.  [facility] [phone]  Other Instructions Vaginal Yeast Infection, Adult  Vaginal yeast infection is a condition that causes vaginal discharge as well as soreness, swelling, and redness (inflammation) of the vagina. This is a common condition. Some women get this infection frequently. What are the causes? This condition is caused by a change in the normal balance of the yeast (Candida) and normal bacteria that live in the vagina. This change causes an overgrowth of yeast, which causes the inflammation. What increases the risk? The condition is more likely to develop in women who: Take  antibiotic medicines. Have diabetes. Take birth control pills. Are pregnant. Douche often. Have a weak body defense system (immune system). Have been taking steroid medicines for a long time. Frequently wear tight clothing. What are the signs or symptoms? Symptoms of this condition include: White, thick, creamy vaginal discharge. Swelling, itching, redness, and irritation of the vagina. The lips of the vagina  (labia) may be affected as well. Pain or a burning feeling while urinating. Pain during sex. How is this diagnosed? This condition is diagnosed based on: Your medical history. A physical exam. A pelvic exam. Your health care provider will examine a sample of your vaginal discharge under a microscope. Your health care provider may send this sample for testing to confirm the diagnosis. How is this treated? This condition is treated with medicine. Medicines may be over-the-counter or prescription. You may be told to use one or more of the following: Medicine that is taken by mouth (orally). Medicine that is applied as a cream (topically). Medicine that is inserted directly into the vagina (suppository). Follow these instructions at home: Take or apply over-the-counter and prescription medicines only as told by your health care provider. Do not use tampons until your health care provider approves. Do not have sex until your infection has cleared. Sex can prolong or worsen your symptoms of infection. Ask your health care provider when it is safe to resume sexual activity. Keep all follow-up visits. This is important. How is this prevented?  Do not wear tight clothes, such as pantyhose or tight pants. Wear breathable cotton underwear. Do not use douches, perfumed soap, creams, or powders. Wipe from front to back after using the toilet. If you have diabetes, keep your blood sugar levels under control. Ask your health care provider for other ways to prevent yeast infections. Contact a health care provider if: You have a fever. Your symptoms go away and then return. Your symptoms do not get better with treatment. Your symptoms get worse. You have new symptoms. You develop blisters in or around your vagina. You have blood coming from your vagina and it is not your menstrual period. You develop pain in your abdomen. Summary Vaginal yeast infection is a condition that causes discharge as well  as soreness, swelling, and redness (inflammation) of the vagina. This condition is treated with medicine. Medicines may be over-the-counter or prescription. Take or apply over-the-counter and prescription medicines only as told by your health care provider. Do not douche. Resume sexual activity or use of tampons as instructed by your health care provider. Contact a health care provider if your symptoms do not get better with treatment or your symptoms go away and then return. This information is not intended to replace advice given to you by your health care provider. Make sure you discuss any questions you have with your health care provider. Document Revised: 05/28/2020 Document Reviewed: 05/28/2020 Elsevier Patient Education  2024 Elsevier Inc.   If you have been instructed to have an in-person evaluation today at a local Urgent Care facility, please use the link below. It will take you to a list of all of our available [facility], including address, phone number and hours of operation. Please do not delay care.  [facility]  If you or a family member do not have a primary care provider, use the link below to schedule a visit and establish care. When you choose a [facility] primary care physician or advanced practice provider, you gain a long-term partner  in health. Find a Primary Care Provider  Learn more about [facility]'s in-office and virtual care options: [facility] - Get Care Now

## 2023-07-14 ENCOUNTER — Ambulatory Visit: Admitting: Obstetrics and Gynecology

## 2023-07-18 ENCOUNTER — Other Ambulatory Visit (INDEPENDENT_AMBULATORY_CARE_PROVIDER_SITE_OTHER): Payer: Self-pay | Admitting: Primary Care

## 2023-07-18 DIAGNOSIS — E119 Type 2 diabetes mellitus without complications: Secondary | ICD-10-CM

## 2023-08-03 ENCOUNTER — Ambulatory Visit: Admitting: Physical Therapy

## 2023-09-15 ENCOUNTER — Other Ambulatory Visit (INDEPENDENT_AMBULATORY_CARE_PROVIDER_SITE_OTHER): Payer: Self-pay | Admitting: Primary Care

## 2023-09-15 ENCOUNTER — Other Ambulatory Visit (INDEPENDENT_AMBULATORY_CARE_PROVIDER_SITE_OTHER): Payer: Self-pay

## 2023-09-15 DIAGNOSIS — I10 Essential (primary) hypertension: Secondary | ICD-10-CM

## 2023-09-15 MED ORDER — AMLODIPINE BESYLATE 10 MG PO TABS: 10.0000 mg | ORAL_TABLET | Freq: Every day | ORAL | 0 refills | Status: DC

## 2023-09-15 MED ORDER — LISINOPRIL 20 MG PO TABS: 20.0000 mg | ORAL_TABLET | Freq: Every day | ORAL | 0 refills | Status: DC

## 2023-10-01 ENCOUNTER — Other Ambulatory Visit (INDEPENDENT_AMBULATORY_CARE_PROVIDER_SITE_OTHER): Payer: Self-pay | Admitting: Primary Care

## 2023-10-01 DIAGNOSIS — I10 Essential (primary) hypertension: Secondary | ICD-10-CM

## 2023-10-02 NOTE — Telephone Encounter (Signed)
 Pt needs appt/ Lisinopril  was reordered for a 30 day refill 09/15/23.  Called pt but unable to LM, VM full. Sent pt Mychart message as well. Last reordered 09/15/23 #30  Requested Prescriptions  Pending Prescriptions Disp Refills   lisinopril  (ZESTRIL ) 20 MG tablet [Pharmacy Med Name: Lisinopril  20 MG Oral Tablet] 90 tablet 0    Sig: Take 1 tablet by mouth once daily     Cardiovascular:  ACE Inhibitors Failed - 10/02/2023  3:07 PM      Failed - Cr in normal range and within 180 days    Creatinine, Ser  Date Value Ref Range Status  04/07/2022 0.76 0.57 - 1.00 mg/dL Final         Failed - K in normal range and within 180 days    Potassium  Date Value Ref Range Status  04/07/2022 3.9 3.5 - 5.2 mmol/L Final         Failed - Valid encounter within last 6 months    Recent Outpatient Visits           6 months ago Cervical cancer screening   [place] [facility] Celestia Rosaline SQUIBB, NP   11 months ago Mixed hyperlipidemia   [place] [facility] Celestia Rosaline SQUIBB, NP   1 year ago Dysuria   [place] [facility] Celestia Rosaline SQUIBB, NP   1 year ago New onset type 2 diabetes mellitus [facility])   [place] [facility] Celestia Rosaline SQUIBB, NP              Passed - Patient is not pregnant      Passed - Last BP in normal range    BP Readings from Last 1 Encounters:  06/04/23 122/87

## 2023-10-11 ENCOUNTER — Other Ambulatory Visit (INDEPENDENT_AMBULATORY_CARE_PROVIDER_SITE_OTHER): Payer: Self-pay | Admitting: Primary Care

## 2023-10-11 DIAGNOSIS — I10 Essential (primary) hypertension: Secondary | ICD-10-CM

## 2023-10-12 ENCOUNTER — Other Ambulatory Visit (INDEPENDENT_AMBULATORY_CARE_PROVIDER_SITE_OTHER): Payer: Self-pay | Admitting: Primary Care

## 2023-10-12 DIAGNOSIS — I10 Essential (primary) hypertension: Secondary | ICD-10-CM

## 2023-10-14 ENCOUNTER — Ambulatory Visit (INDEPENDENT_AMBULATORY_CARE_PROVIDER_SITE_OTHER): Payer: Self-pay | Admitting: Primary Care

## 2023-10-14 ENCOUNTER — Telehealth (INDEPENDENT_AMBULATORY_CARE_PROVIDER_SITE_OTHER): Payer: Self-pay

## 2023-10-14 ENCOUNTER — Encounter (INDEPENDENT_AMBULATORY_CARE_PROVIDER_SITE_OTHER): Admitting: Primary Care

## 2023-10-14 ENCOUNTER — Encounter (INDEPENDENT_AMBULATORY_CARE_PROVIDER_SITE_OTHER): Payer: Self-pay | Admitting: Primary Care

## 2023-10-14 VITALS — BP 117/84 | HR 92 | Resp 16 | Ht 62.0 in | Wt 155.6 lb

## 2023-10-14 DIAGNOSIS — Z7984 Long term (current) use of oral hypoglycemic drugs: Secondary | ICD-10-CM

## 2023-10-14 DIAGNOSIS — E782 Mixed hyperlipidemia: Secondary | ICD-10-CM

## 2023-10-14 DIAGNOSIS — E119 Type 2 diabetes mellitus without complications: Secondary | ICD-10-CM

## 2023-10-14 DIAGNOSIS — I10 Essential (primary) hypertension: Secondary | ICD-10-CM

## 2023-10-14 NOTE — Progress Notes (Signed)
 [facility]   Yvette Perez is a 33 y.o. female presents for hypertension evaluation, Denies shortness of breath, headaches, chest pain or lower extremity edema, sudden onset, vision changes, unilateral weakness, dizziness, paresthesias   Patient reports adherence with medications.   Past Medical History:  Diagnosis Date   Medical history non-contributory    Renal disorder    UTI (urinary tract infection)    Past Surgical History:  Procedure Laterality Date   NO PAST SURGERIES     No Known Allergies Current Outpatient Medications on File Prior to Visit  Medication Sig Dispense Refill   Accu-Chek Softclix Lancets lancets Use as instructed (Patient not taking: Reported on 06/04/2023) 100 each 12   amLODipine  (NORVASC ) 10 MG tablet Take 1 tablet (10 mg total) by mouth daily. 30 tablet 0   atorvastatin  (LIPITOR) 10 MG tablet Take 1 tablet (10 mg total) by mouth daily. 90 tablet 1   etonogestrel (NEXPLANON) 68 MG IMPL implant 1 each by Subdermal route once.     fluconazole  (DIFLUCAN ) 150 MG tablet Take 1 tablet PO once. Repeat in 3 days if needed. 2 tablet 0   glucose blood (ACCU-CHEK GUIDE) test strip 1 each by Other route 2 (two) times daily. Use as instructed 100 each 12   lisinopril  (ZESTRIL ) 20 MG tablet Take 1 tablet (20 mg total) by mouth daily. 30 tablet 0   metFORMIN  (GLUCOPHAGE -XR) 500 MG 24 hr tablet Take 1 tablet by mouth once daily with breakfast 90 tablet 0   No current facility-administered medications on file prior to visit.   Social History   Socioeconomic History   Marital status: Single    Spouse name: Not on file   Number of children: Not on file   Years of education: Not on file   Highest education level: Associate degree: occupational, Scientist, product/process development, or vocational program  Occupational History   Not on file  Tobacco Use   Smoking status: Every Day    Current packs/day: 1.00    Types: Cigars, Cigarettes   Smokeless tobacco: Never  Substance  and Sexual Activity   Alcohol use: No   Drug use: No   Sexual activity: Not on file  Other Topics Concern   Not on file  Social History Narrative   Not on file   Social Drivers of Health   Financial Resource Strain: Not on file  Food Insecurity: Food Insecurity Present (06/04/2023)   Hunger Vital Sign    Worried About Running Out of Food in the Last Year: Sometimes true    Ran Out of Food in the Last Year: Never true  Transportation Needs: No Transportation Needs (06/04/2023)   PRAPARE - Administrator, Civil Service (Medical): No    Lack of Transportation (Non-Medical): No  Physical Activity: Not on file  Stress: Not on file  Social Connections: Not on file  Intimate Partner Violence: Not At Risk (03/09/2023)   Humiliation, Afraid, Rape, and Kick questionnaire    Fear of Current or Ex-Partner: No    Emotionally Abused: No    Physically Abused: No    Sexually Abused: No   No family history on file. Health Maintenance  Topic Date Due   FOOT EXAM  Never done   OPHTHALMOLOGY EXAM  Never done   DTaP/Tdap/Td (6 - Tdap) 12/26/2003   Pneumococcal Vaccine 57-57 Years old (1 of 2 - PCV) Never done   HPV VACCINES (1 - 3-dose SCDM series) Never done   COVID-19 Vaccine (3 -  2024-25 season) 11/23/2022   Diabetic kidney evaluation - eGFR measurement  04/08/2023   Diabetic kidney evaluation - Urine ACR  04/16/2023   INFLUENZA VACCINE  10/23/2023   HEMOGLOBIN A1C  12/05/2023   Cervical Cancer Screening (HPV/Pap Cotest)  03/09/2028   Hepatitis B Vaccines  Completed   Hepatitis C Screening  Completed   HIV Screening  Completed   Meningococcal B Vaccine  Aged Out     OBJECTIVE:  Vitals:   10/14/23 1515  BP: 117/84  Pulse: 92  Resp: 16  SpO2: 100%  Weight: 155 lb 9.6 oz (70.6 kg)  Height: 5' 2 (1.575 m)    Physical Exam Vitals reviewed.  Constitutional:      Appearance: Normal appearance.  HENT:     Head: Normocephalic.     Right Ear: Tympanic membrane, ear  canal and external ear normal.     Left Ear: Tympanic membrane, ear canal and external ear normal.     Nose: Nose normal.     Mouth/Throat:     Mouth: Mucous membranes are moist.  Eyes:     Extraocular Movements: Extraocular movements intact.     Pupils: Pupils are equal, round, and reactive to light.  Cardiovascular:     Rate and Rhythm: Normal rate and regular rhythm.  Pulmonary:     Effort: Pulmonary effort is normal.     Breath sounds: Normal breath sounds.  Abdominal:     General: Bowel sounds are normal.     Palpations: Abdomen is soft.  Musculoskeletal:        General: Normal range of motion.     Cervical back: Normal range of motion.  Skin:    General: Skin is warm and dry.  Neurological:     Mental Status: She is alert and oriented to person, place, and time.  Psychiatric:        Mood and Affect: Mood normal.        Behavior: Behavior normal.        Thought Content: Thought content normal.      ROS  Last 3 Office BP readings: BP Readings from Last 3 Encounters:  10/14/23 117/84  06/04/23 122/87  03/09/23 121/80    BMET    Component Value Date/Time   NA 137 04/07/2022 1101   K 3.9 04/07/2022 1101   CL 99 04/07/2022 1101   CO2 23 04/07/2022 1101   GLUCOSE 303 (H) 04/07/2022 1101   GLUCOSE 110 (H) 03/02/2022 2143   BUN 9 04/07/2022 1101   CREATININE 0.76 04/07/2022 1101   CALCIUM  10.5 (H) 04/07/2022 1101   GFRNONAA >60 03/02/2022 2143   GFRAA >60 09/02/2018 1503    Renal function: CrCl cannot be calculated (Patient's most recent lab result is older than the maximum 21 days allowed.).  Clinical ASCVD: No  The ASCVD Risk score (Arnett DK, et al., 2019) failed to calculate for the following reasons:   The 2019 ASCVD risk score is only valid for ages 75 to 39  ASCVD risk factors include- [place]   ASSESSMENT & PLAN: Daisie was seen today for hyperlipidemia, hypertension and medication refill.  Diagnoses and all orders for this visit:  New onset  type 2 diabetes mellitus (HCC) - educated on lifestyle modifications, including but not limited to diet choices and adding exercise to daily routine.   -     Microalbumin / creatinine urine ratio -     CMP14+EGFR -     Hemoglobin A1c -  Lipid panel  Essential hypertension Well controlled BP goal - < 130/80 DIET: Limit salt intake, read nutrition labels to check salt content, limit fried and high fatty foods  Avoid using multisymptom OTC cold preparations that generally contain sudafed which can rise BP. Consult with pharmacist on best cold relief products to use for persons with HTN EXERCISE Discussed incorporating exercise such as walking - 30 minutes most days of the week and can do in 10 minute intervals    -     CBC with Differential/Platelet -     CMP14+EGFR  Mixed hyperlipidemia -     Lipid panel     -This note has been created with Education officer, environmental. Any transcriptional errors are unintentional.   Rosaline SHAUNNA Bohr, NP 10/14/2023, 3:21 PM

## 2023-10-14 NOTE — Progress Notes (Deleted)
  [facility]  {tele v video:31751}  I connected with Yvette Perez, on 10/14/2023 at 11:33 AM {tele vs video:31745} and verified that I am speaking with the correct person using two identifiers.   Consent: I discussed the limitations, risks, security and privacy concerns of performing an evaluation and management service by {telephone/mychart:25033} and the availability of in person appointments. I also discussed with the patient that there may be a patient responsible charge related to this service. The patient expressed understanding and agreed to proceed.   Location of Patient: ***  Location of Provider: [facility]   Persons participating in visit: Yvette Perez Rosaline Celestia,  NP   History of Present Illness: ***   Past Medical History:  Diagnosis Date   Medical history non-contributory    Renal disorder    UTI (urinary tract infection)    No Known Allergies  Current Outpatient Medications on File Prior to Visit  Medication Sig Dispense Refill   Accu-Chek Softclix Lancets lancets Use as instructed (Patient not taking: Reported on 06/04/2023) 100 each 12   amLODipine  (NORVASC ) 10 MG tablet Take 1 tablet (10 mg total) by mouth daily. 30 tablet 0   atorvastatin  (LIPITOR) 10 MG tablet Take 1 tablet (10 mg total) by mouth daily. 90 tablet 1   etonogestrel (NEXPLANON) 68 MG IMPL implant 1 each by Subdermal route once.     fluconazole  (DIFLUCAN ) 150 MG tablet Take 1 tablet PO once. Repeat in 3 days if needed. 2 tablet 0   glucose blood (ACCU-CHEK GUIDE) test strip 1 each by Other route 2 (two) times daily. Use as instructed 100 each 12   lisinopril  (ZESTRIL ) 20 MG tablet Take 1 tablet (20 mg total) by mouth daily. 30 tablet 0   metFORMIN  (GLUCOPHAGE -XR) 500 MG 24 hr tablet Take 1 tablet by mouth once daily with breakfast 90 tablet 0   No current facility-administered medications on file prior to visit.     Observations/Objective: ***  Assessment and Plan: ***  Follow Up Instructions: ***   I discussed the assessment and treatment plan with the patient. The patient was provided an opportunity to ask questions and all were answered. The patient agreed with the plan and demonstrated an understanding of the instructions.   The patient was advised to call back or seek an in-person evaluation if the symptoms worsen or if the condition fails to improve as anticipated.     I provided *** minutes total time during this encounter including median intraservice time, reviewing previous notes, investigations, ordering medications, medical decision making, coordinating care and patient verbalized understanding at the end of the visit.    This note has been created with Education officer, environmental. Any transcriptional errors are unintentional.   Rosaline SHAUNNA Celestia, NP 10/14/2023, 11:33 AM

## 2023-10-14 NOTE — Telephone Encounter (Signed)
 Copied from CRM [id number]. Topic: Clinical - Medical Advice >> Oct 14, 2023 11:39 AM Larissa RAMAN wrote: Reason for CRM: Patient calling to inform provider that she ate cereal for breakfast and would like to know if she should keep her lab appointment. Patient requesting a callback.    ----------------------------------------------------------------------- From previous Reason for Contact - Other: Reason for CRM:

## 2023-10-14 NOTE — Telephone Encounter (Signed)
 Reached out to pt and she will be coming in at 2

## 2023-10-15 ENCOUNTER — Encounter (INDEPENDENT_AMBULATORY_CARE_PROVIDER_SITE_OTHER): Payer: Self-pay | Admitting: Primary Care

## 2023-10-15 ENCOUNTER — Ambulatory Visit: Payer: Self-pay

## 2023-10-15 LAB — CBC WITH DIFFERENTIAL/PLATELET
Basophils Absolute: 0 x10E3/uL (ref 0.0–0.2)
Basos: 0 %
EOS (ABSOLUTE): 0.2 x10E3/uL (ref 0.0–0.4)
Eos: 3 %
Hematocrit: 39.9 % (ref 34.0–46.6)
Hemoglobin: 12.5 g/dL (ref 11.1–15.9)
Immature Grans (Abs): 0.1 x10E3/uL (ref 0.0–0.1)
Immature Granulocytes: 1 %
Lymphocytes Absolute: 2 x10E3/uL (ref 0.7–3.1)
Lymphs: 26 %
MCH: 27.2 pg (ref 26.6–33.0)
MCHC: 31.3 g/dL — ABNORMAL LOW (ref 31.5–35.7)
MCV: 87 fL (ref 79–97)
Monocytes Absolute: 0.7 x10E3/uL (ref 0.1–0.9)
Monocytes: 9 %
Neutrophils Absolute: 4.9 x10E3/uL (ref 1.4–7.0)
Neutrophils: 61 %
Platelets: 322 x10E3/uL (ref 150–450)
RBC: 4.6 x10E6/uL (ref 3.77–5.28)
RDW: 13 % (ref 11.7–15.4)
WBC: 8 x10E3/uL (ref 3.4–10.8)

## 2023-10-15 LAB — CMP14+EGFR
ALT: 11 IU/L (ref 0–32)
AST: 9 IU/L (ref 0–40)
Albumin: 4.6 g/dL (ref 4.0–5.0)
Alkaline Phosphatase: 67 IU/L (ref 44–121)
BUN/Creatinine Ratio: 12 (ref 9–23)
BUN: 9 mg/dL (ref 6–20)
Bilirubin Total: 0.3 mg/dL (ref 0.0–1.2)
CO2: 21 mmol/L (ref 20–29)
Calcium: 10.1 mg/dL (ref 8.7–10.2)
Chloride: 101 mmol/L (ref 96–106)
Creatinine, Ser: 0.77 mg/dL (ref 0.57–1.00)
Globulin, Total: 2.8 g/dL (ref 1.5–4.5)
Glucose: 106 mg/dL — ABNORMAL HIGH (ref 70–99)
Potassium: 4.7 mmol/L (ref 3.5–5.2)
Sodium: 139 mmol/L (ref 134–144)
Total Protein: 7.4 g/dL (ref 6.0–8.5)
eGFR: 106 mL/min/1.73 (ref >59)

## 2023-10-15 LAB — LIPID PANEL
Chol/HDL Ratio: 3.5 ratio (ref 0.0–4.4)
Cholesterol, Total: 147 mg/dL (ref 100–199)
HDL: 42 mg/dL (ref >39)
LDL Chol Calc (NIH): 84 mg/dL (ref 0–99)
Triglycerides: 119 mg/dL (ref 0–149)
VLDL Cholesterol Cal: 21 mg/dL (ref 5–40)

## 2023-10-15 LAB — HEMOGLOBIN A1C
Est. average glucose Bld gHb Est-mCnc: 166 mg/dL
Hgb A1c MFr Bld: 7.4 % — ABNORMAL HIGH (ref 4.8–5.6)

## 2023-10-15 LAB — MICROALBUMIN / CREATININE URINE RATIO
Creatinine, Urine: 89 mg/dL
Microalb/Creat Ratio: 8 mg/g{creat} (ref 0–29)
Microalbumin, Urine: 6.7 ug/mL

## 2023-10-15 NOTE — Telephone Encounter (Signed)
   Copied from CRM [id number]. Topic: Clinical - Lab/Test Results >> Oct 15, 2023 12:46 PM Cleave MATSU wrote: Reason for CRM: pt wants to know test results Answer Assessment - Initial Assessment Questions 1. REASON FOR CALL or QUESTION: What is your reason for calling today? or How can I best     Patient requesting lab result call - not yet documented by provider. Routed patient's office with request.  Protocols used: PCP Call - No Triage-A-AH

## 2023-10-15 NOTE — Telephone Encounter (Signed)
 Will forward to provider

## 2023-10-16 ENCOUNTER — Other Ambulatory Visit (INDEPENDENT_AMBULATORY_CARE_PROVIDER_SITE_OTHER): Payer: Self-pay | Admitting: Primary Care

## 2023-10-16 ENCOUNTER — Ambulatory Visit: Payer: Self-pay | Admitting: Primary Care

## 2023-10-16 DIAGNOSIS — I10 Essential (primary) hypertension: Secondary | ICD-10-CM

## 2023-10-19 ENCOUNTER — Other Ambulatory Visit (INDEPENDENT_AMBULATORY_CARE_PROVIDER_SITE_OTHER): Payer: Self-pay | Admitting: Primary Care

## 2023-10-19 DIAGNOSIS — I10 Essential (primary) hypertension: Secondary | ICD-10-CM

## 2023-10-19 NOTE — Telephone Encounter (Signed)
 Requested Prescriptions  Pending Prescriptions Disp Refills   amLODipine  (NORVASC ) 10 MG tablet [Pharmacy Med Name: amLODIPine  Besylate 10 MG Oral Tablet] 90 tablet 1    Sig: Take 1 tablet by mouth once daily     Cardiovascular: Calcium  Channel Blockers 2 Passed - 10/19/2023 11:19 AM      Passed - Last BP in normal range    BP Readings from Last 1 Encounters:  10/14/23 117/84         Passed - Last Heart Rate in normal range    Pulse Readings from Last 1 Encounters:  10/14/23 92         Passed - Valid encounter within last 6 months    Recent Outpatient Visits           5 days ago New onset type 2 diabetes mellitus (HCC)   [place] [facility] Celestia Rosaline SQUIBB, NP   5 days ago    [facility] Celestia Rosaline SQUIBB, NP   7 months ago Cervical cancer screening   [place] [facility] Celestia Rosaline SQUIBB, NP   11 months ago Mixed hyperlipidemia   [place] [facility] Celestia Rosaline SQUIBB, NP   1 year ago Dysuria   [place] [facility] Celestia Rosaline SQUIBB, NP               lisinopril  (ZESTRIL ) 20 MG tablet [Pharmacy Med Name: Lisinopril  20 MG Oral Tablet] 90 tablet 1    Sig: Take 1 tablet by mouth once daily     Cardiovascular:  ACE Inhibitors Passed - 10/19/2023 11:19 AM      Passed - Cr in normal range and within 180 days    Creatinine, Ser  Date Value Ref Range Status  10/14/2023 0.77 0.57 - 1.00 mg/dL Final         Passed - K in normal range and within 180 days    Potassium  Date Value Ref Range Status  10/14/2023 4.7 3.5 - 5.2 mmol/L Final         Passed - Patient is not pregnant      Passed - Last BP in normal range    BP Readings from Last 1 Encounters:  10/14/23 117/84         Passed - Valid encounter within last 6 months    Recent Outpatient Visits           5 days ago New onset type 2 diabetes mellitus (HCC)   [place] [facility] Celestia Rosaline SQUIBB, NP   5 days ago    [facility] Celestia Rosaline SQUIBB, NP   7 months ago Cervical cancer screening   [place] [facility] Celestia Rosaline SQUIBB, NP   11 months ago Mixed hyperlipidemia   [place] [facility] Celestia Rosaline SQUIBB, NP   1 year ago Dysuria    [facility] Celestia Rosaline SQUIBB, NP

## 2023-10-19 NOTE — Progress Notes (Signed)
 Error

## 2023-10-19 NOTE — Telephone Encounter (Signed)
 Will forward to provider

## 2023-10-22 ENCOUNTER — Ambulatory Visit (INDEPENDENT_AMBULATORY_CARE_PROVIDER_SITE_OTHER): Payer: Self-pay | Admitting: Primary Care

## 2023-10-29 ENCOUNTER — Other Ambulatory Visit (INDEPENDENT_AMBULATORY_CARE_PROVIDER_SITE_OTHER): Payer: Self-pay | Admitting: Primary Care

## 2023-10-29 DIAGNOSIS — E119 Type 2 diabetes mellitus without complications: Secondary | ICD-10-CM

## 2023-11-02 NOTE — Telephone Encounter (Signed)
 Requested Prescriptions  Pending Prescriptions Disp Refills   metFORMIN  (GLUCOPHAGE -XR) 500 MG 24 hr tablet [Pharmacy Med Name: metFORMIN  HCl ER 500 MG Oral Tablet Extended Release 24 Hour] 90 tablet 0    Sig: Take 1 tablet by mouth once daily with breakfast     Endocrinology:  Diabetes - Biguanides Failed - 11/02/2023  3:35 PM      Failed - B12 Level in normal range and within 720 days    No results found for: VITAMINB12       Passed - Cr in normal range and within 360 days    Creatinine, Ser  Date Value Ref Range Status  10/14/2023 0.77 0.57 - 1.00 mg/dL Final         Passed - HBA1C is between 0 and 7.9 and within 180 days    HbA1c, POC (controlled diabetic range)  Date Value Ref Range Status  08/08/2022 6.5 0.0 - 7.0 % Final   Hgb A1c MFr Bld  Date Value Ref Range Status  10/14/2023 7.4 (H) 4.8 - 5.6 % Final    Comment:             Prediabetes: 5.7 - 6.4          Diabetes: >6.4          Glycemic control for adults with diabetes: <7.0          Passed - eGFR in normal range and within 360 days    GFR calc Af Amer  Date Value Ref Range Status  09/02/2018 >60 >60 mL/min Final   GFR, Estimated  Date Value Ref Range Status  03/02/2022 >60 >60 mL/min Final    Comment:    (NOTE) Calculated using the CKD-EPI Creatinine Equation (2021)    eGFR  Date Value Ref Range Status  10/14/2023 106 >59 mL/min/1.73 Final         Passed - Valid encounter within last 6 months    Recent Outpatient Visits           2 weeks ago New onset type 2 diabetes mellitus (HCC)   [place] [facility] Celestia Rosaline SQUIBB, NP   7 months ago Cervical cancer screening   [place] [facility] Celestia Rosaline SQUIBB, NP   12 months ago Mixed hyperlipidemia   [place] [facility] Celestia Rosaline SQUIBB, NP   1 year ago Dysuria   [place] [facility] Celestia Rosaline SQUIBB, NP   1 year ago New onset type 2 diabetes mellitus (HCC)     [facility] Celestia Rosaline SQUIBB, NP              Passed - CBC within normal limits and completed in the last 12 months    WBC  Date Value Ref Range Status  10/14/2023 8.0 3.4 - 10.8 x10E3/uL Final  03/02/2022 8.6 4.0 - 10.5 K/uL Final   RBC  Date Value Ref Range Status  10/14/2023 4.60 3.77 - 5.28 x10E6/uL Final  03/02/2022 5.12 (H) 3.87 - 5.11 MIL/uL Final   Hemoglobin  Date Value Ref Range Status  10/14/2023 12.5 11.1 - 15.9 g/dL Final   Hematocrit  Date Value Ref Range Status  10/14/2023 39.9 34.0 - 46.6 % Final   MCHC  Date Value Ref Range Status  10/14/2023 31.3 (L) 31.5 - 35.7 g/dL Final  87/89/7976 67.4 30.0 - 36.0 g/dL Final   [facility]  Date Value Ref Range Status  10/14/2023 27.2 26.6 - 33.0 pg  Final  03/02/2022 26.2 26.0 - 34.0 pg Final   MCV  Date Value Ref Range Status  10/14/2023 87 79 - 97 fL Final   No results found for: PLTCOUNTKUC, LABPLAT, POCPLA RDW  Date Value Ref Range Status  10/14/2023 13.0 11.7 - 15.4 % Final

## 2023-11-20 ENCOUNTER — Emergency Department (HOSPITAL_BASED_OUTPATIENT_CLINIC_OR_DEPARTMENT_OTHER)
Admission: EM | Admit: 2023-11-20 | Discharge: 2023-11-20 | Disposition: A | Discharge status: Home / Self Care | Attending: Emergency Medicine | Admitting: Emergency Medicine

## 2023-11-20 ENCOUNTER — Emergency Department (HOSPITAL_BASED_OUTPATIENT_CLINIC_OR_DEPARTMENT_OTHER)

## 2023-11-20 ENCOUNTER — Other Ambulatory Visit: Payer: Self-pay

## 2023-11-20 DIAGNOSIS — D72829 Elevated white blood cell count, unspecified: Secondary | ICD-10-CM | POA: Diagnosis not present

## 2023-11-20 DIAGNOSIS — R0789 Other chest pain: Secondary | ICD-10-CM | POA: Insufficient documentation

## 2023-11-20 LAB — TROPONIN T, HIGH SENSITIVITY
Troponin T High Sensitivity: <15 ng/L (ref 0–19)
Troponin T High Sensitivity: <15 ng/L (ref 0–19)

## 2023-11-20 LAB — BASIC METABOLIC PANEL WITH GFR
Anion gap: 17 — ABNORMAL HIGH (ref 5–15)
BUN: 12 mg/dL (ref 6–20)
CO2: 22 mmol/L (ref 22–32)
Calcium: 9.9 mg/dL (ref 8.9–10.3)
Chloride: 101 mmol/L (ref 98–111)
Creatinine, Ser: 0.78 mg/dL (ref 0.44–1.00)
GFR, Estimated: >60 mL/min (ref >60)
Glucose, Bld: 106 mg/dL — ABNORMAL HIGH (ref 70–99)
Potassium: 3.7 mmol/L (ref 3.5–5.1)
Sodium: 139 mmol/L (ref 135–145)

## 2023-11-20 LAB — CBC
HCT: 37.2 % (ref 36.0–46.0)
Hemoglobin: 12.2 g/dL (ref 12.0–15.0)
MCH: 27 pg (ref 26.0–34.0)
MCHC: 32.8 g/dL (ref 30.0–36.0)
MCV: 82.3 fL (ref 80.0–100.0)
Platelets: 351 K/uL (ref 150–400)
RBC: 4.52 MIL/uL (ref 3.87–5.11)
RDW: 13.1 % (ref 11.5–15.5)
WBC: 10.8 K/uL — ABNORMAL HIGH (ref 4.0–10.5)
nRBC: 0 % (ref 0.0–0.2)

## 2023-11-20 LAB — HCG, SERUM, QUALITATIVE: Preg, Serum: NEGATIVE

## 2023-11-20 LAB — D-DIMER, QUANTITATIVE: D-Dimer, Quant: 0.31 ug{FEU}/mL (ref 0.00–0.50)

## 2023-11-20 NOTE — ED Provider Notes (Signed)
 [place] [facility] Provider Note   CSN: [id number] Arrival date & time: 11/20/23  9647     Patient presents with: Chest Pain   Yvette Perez is a 33 y.o. female.   Presents to the emergency department for evaluation of chest pain.  Patient reports that her symptoms have been intermittent for the last 2 days.  She first noticed the heaviness in her chest after she took a Advertising copywriter tablet.  She does notice that it hurts more if she bends over but her chest is not tender to touch.  She is not short of breath.       Prior to Admission medications   Medication Sig Start Date End Date Taking? Authorizing Provider  Accu-Chek Softclix Lancets lancets Use as instructed Patient not taking: Reported on 06/04/2023 04/15/22   Mayers, Cari S, PA-C  amLODipine  (NORVASC ) 10 MG tablet Take 1 tablet by mouth once daily 10/19/23   Celestia Rosaline SQUIBB, NP  atorvastatin  (LIPITOR) 10 MG tablet Take 1 tablet (10 mg total) by mouth daily. 10/11/22   Celestia Rosaline SQUIBB, NP  etonogestrel (NEXPLANON) 68 MG IMPL implant 1 each by Subdermal route once.    [provider]  fluconazole  (DIFLUCAN ) 150 MG tablet Take 1 tablet PO once. Repeat in 3 days if needed. 07/08/23   Gladis Elsie BROCKS, PA-C  glucose blood (ACCU-CHEK GUIDE) test strip 1 each by Other route 2 (two) times daily. Use as instructed 04/15/22   Mayers, Cari S, PA-C  lisinopril  (ZESTRIL ) 20 MG tablet Take 1 tablet by mouth once daily 10/19/23   Celestia Rosaline SQUIBB, NP  metFORMIN  (GLUCOPHAGE -XR) 500 MG 24 hr tablet Take 1 tablet by mouth once daily with breakfast 11/02/23   Celestia Rosaline SQUIBB, NP    Allergies: Patient has no known allergies.    Review of Systems  Updated Vital Signs BP 119/89 (BP Location: Right Arm)   Pulse 96   Temp 98.9 F (37.2 C) (Oral)   Resp 17   Ht 5' 2 (1.575 m)   Wt 70.3 kg   LMP 06/20/2023 (Approximate)   SpO2 98%   BMI 28.35 kg/m   Physical Exam Vitals and  nursing note reviewed.  Constitutional:      General: She is not in acute distress.    Appearance: She is well-developed.  HENT:     Head: Normocephalic and atraumatic.     Mouth/Throat:     Mouth: Mucous membranes are moist.  Eyes:     General: Vision grossly intact. Gaze aligned appropriately.     Extraocular Movements: Extraocular movements intact.     Conjunctiva/sclera: Conjunctivae normal.  Cardiovascular:     Rate and Rhythm: Normal rate and regular rhythm.     Pulses: Normal pulses.     Heart sounds: Normal heart sounds, S1 normal and S2 normal. No murmur heard.    No friction rub. No gallop.  Pulmonary:     Effort: Pulmonary effort is normal. No respiratory distress.     Breath sounds: Normal breath sounds.  Abdominal:     General: Bowel sounds are normal.     Palpations: Abdomen is soft.     Tenderness: There is no abdominal tenderness. There is no guarding or rebound.     Hernia: No hernia is present.  Musculoskeletal:        General: No swelling.     Cervical back: Full passive range of motion without pain, normal range of motion and neck  supple. No spinous process tenderness or muscular tenderness. Normal range of motion.     Right lower leg: No edema.     Left lower leg: No edema.  Skin:    General: Skin is warm and dry.     Capillary Refill: Capillary refill takes less than 2 seconds.     Findings: No ecchymosis, erythema, rash or wound.  Neurological:     General: No focal deficit present.     Mental Status: She is alert and oriented to person, place, and time.     GCS: GCS eye subscore is 4. GCS verbal subscore is 5. GCS motor subscore is 6.     Cranial Nerves: Cranial nerves 2-12 are intact.     Sensory: Sensation is intact.     Motor: Motor function is intact.     Coordination: Coordination is intact.  Psychiatric:        Attention and Perception: Attention normal.        Mood and Affect: Mood normal.        Speech: Speech normal.        Behavior:  Behavior normal.     (all labs ordered are listed, but only abnormal results are displayed) Labs Reviewed  BASIC METABOLIC PANEL WITH GFR - Abnormal; Notable for the following components:      Result Value   Glucose, Bld 106 (*)    Anion gap 17 (*)    All other components within normal limits  CBC - Abnormal; Notable for the following components:   WBC 10.8 (*)    All other components within normal limits  D-DIMER, QUANTITATIVE  HCG, SERUM, QUALITATIVE  TROPONIN T, HIGH SENSITIVITY    EKG: EKG Interpretation Date/Time:  Friday November 20 2023 04:00:24 EDT Ventricular Rate:  97 PR Interval:  135 QRS Duration:  88 QT Interval:  344 QTC Calculation: 437 R Axis:   88  Text Interpretation: Sinus rhythm Borderline T abnormalities, anterior leads Confirmed by Haze Lonni PARAS [phone]) on 11/20/2023 4:03:14 AM  Radiology: [facility] Chest 2 View Result Date: 11/20/2023 CLINICAL DATA:  33 year old female with intermittent chest pain for 2 days. EXAM: CHEST - 2 VIEW COMPARISON:  Chest radiographs 11/15/2013. CT Abdomen and Pelvis 12/13/2020 and earlier. FINDINGS: PA and lateral views. Lower lung volumes on both views and asymmetric linear and streaky opacity in the right middle lobe on both views. Lung markings elsewhere appear normal. No consolidation or pleural effusion. Normal cardiac size and mediastinal contours. Visualized tracheal air column is within normal limits. No pneumothorax. Negative visible bowel gas and osseous structures. IMPRESSION: Lower lung volumes with asymmetric right lower lung streaky opacity, nonspecific but suspicious for right middle lobe bronchopneumonia. No pleural effusion. Electronically Signed   By: VEAR Hurst M.D.   On: 11/20/2023 04:39     Procedures   Medications Ordered in the ED - No data to display                                  Medical Decision Making Amount and/or Complexity of Data Reviewed External Data Reviewed: ECG. Labs: ordered.  Decision-making details documented in ED Course. Radiology: ordered and independent interpretation performed. Decision-making details documented in ED Course. ECG/medicine tests: ordered and independent interpretation performed. Decision-making details documented in ED Course.   Differential Diagnosis considered includes, but not limited to: STEMI; NSTEMI; myocarditis; pericarditis; pulmonary embolism; aortic dissection; pneumothorax; pneumonia; gastritis; musculoskeletal pain  Presents to the emergency department for evaluation of intermittent chest pain for 2 days.  No known cardiac history.  Patient mildly tachycardic at arrival, denies shortness of breath.  Patient reports that symptoms began after taking an energy tablet.  EKG unchanged from prior.  Troponin negative.  A D-dimer was ordered because she did have mild tachycardia and has a Nexplanon implant.  No tachypnea, hypoxia or perceived shortness of breath.  D-dimer is negative.  She is low risk by Wells criteria.  No further workup necessary to rule out PE.  Chest x-ray clear.  Workup reassuring.  Patient reassured, she should avoid energy products in the future, follow-up primary care as needed.     Final diagnoses:  Atypical chest pain    ED Discharge Orders     None          Harleyquinn Gasser, Lonni PARAS, MD 11/20/23 [phone]

## 2023-11-20 NOTE — ED Triage Notes (Signed)
 Intermittant CP for 2 days. Denies cold/flu symptoms.

## 2023-12-16 ENCOUNTER — Other Ambulatory Visit (INDEPENDENT_AMBULATORY_CARE_PROVIDER_SITE_OTHER): Payer: Self-pay | Admitting: Primary Care

## 2023-12-16 DIAGNOSIS — E782 Mixed hyperlipidemia: Secondary | ICD-10-CM

## 2023-12-17 NOTE — Telephone Encounter (Signed)
 Requested Prescriptions  Pending Prescriptions Disp Refills   atorvastatin  (LIPITOR) 10 MG tablet [Pharmacy Med Name: Atorvastatin  Calcium  10 MG Oral Tablet] 90 tablet 0    Sig: Take 1 tablet by mouth once daily     Cardiovascular:  Antilipid - Statins Failed - 12/17/2023  2:32 PM      Failed - Lipid Panel in normal range within the last 12 months    Cholesterol, Total  Date Value Ref Range Status  10/14/2023 147 100 - 199 mg/dL Final   LDL Chol Calc (NIH)  Date Value Ref Range Status  10/14/2023 84 0 - 99 mg/dL Final   HDL  Date Value Ref Range Status  10/14/2023 42 >39 mg/dL Final   Triglycerides  Date Value Ref Range Status  10/14/2023 119 0 - 149 mg/dL Final         Passed - Patient is not pregnant      Passed - Valid encounter within last 12 months    Recent Outpatient Visits           2 months ago New onset type 2 diabetes mellitus (HCC)   [place] [facility] Celestia Rosaline SQUIBB, NP   9 months ago Cervical cancer screening   [place] [facility] Celestia Rosaline SQUIBB, NP   1 year ago Mixed hyperlipidemia   [place] [facility] Celestia Rosaline SQUIBB, NP   1 year ago Dysuria   [place] [facility] Celestia Rosaline SQUIBB, NP   1 year ago New onset type 2 diabetes mellitus [facility])   [place] [facility] Celestia Rosaline SQUIBB, NP

## 2023-12-19 ENCOUNTER — Other Ambulatory Visit (INDEPENDENT_AMBULATORY_CARE_PROVIDER_SITE_OTHER): Payer: Self-pay | Admitting: Primary Care

## 2023-12-19 DIAGNOSIS — E119 Type 2 diabetes mellitus without complications: Secondary | ICD-10-CM

## 2023-12-23 ENCOUNTER — Telehealth: Payer: Self-pay

## 2023-12-23 NOTE — Telephone Encounter (Signed)
 Copied from CRM #[id number]. Topic: Clinical - Prescription Issue >> Dec 23, 2023  5:08 PM Yvette Perez wrote: Reason for CRM: Patient is trying to refill her medication metFORMIN  (GLUCOPHAGE -XR) 500 MG 24  and it looks like it's been discontinued and patient wants to know why and she's all out of the medication. Callback number [phone]

## 2023-12-24 NOTE — Telephone Encounter (Signed)
 Call to patient to advised that metformin  was sent to [facility] in [place] on 12/20/2023 for 90 days supply. Unable to reach VM left.

## 2023-12-29 ENCOUNTER — Telehealth (INDEPENDENT_AMBULATORY_CARE_PROVIDER_SITE_OTHER): Payer: Self-pay | Admitting: Primary Care

## 2023-12-29 DIAGNOSIS — E119 Type 2 diabetes mellitus without complications: Secondary | ICD-10-CM

## 2023-12-29 MED ORDER — METFORMIN HCL ER 500 MG PO TB24: 500.0000 mg | ORAL_TABLET | Freq: Two times a day (BID) | ORAL | 0 refills | Status: DC

## 2023-12-29 NOTE — Telephone Encounter (Addendum)
 Multiple messages taken for the same concern....   Please advise patient: Requested Rx was sent in on 12/24/2023.   Left message on voicemail to return call.

## 2023-12-29 NOTE — Telephone Encounter (Signed)
 Multiple messages taken for the same concern....   Please advise patient: Requested Rx was sent in on 12/24/2023.   Left message on voicemail to return call.

## 2023-12-29 NOTE — Telephone Encounter (Signed)
 Patient calling back as she spoke with the pharmacy and was advised the script does not read to take 2 tablets per day of the metFORMIN  (GLUCOPHAGE -XR) 500 MG 24 hr tablet. There is a script ready but it's only for 1x per day. Please f/u with pharmacy and let patient know when the correction has been made.

## 2023-12-29 NOTE — Telephone Encounter (Signed)
 Copied from CRM #[id number]. Topic: Clinical - Prescription Issue >> Dec 23, 2023  5:08 PM Yvette Perez wrote: Reason for CRM: Patient is trying to refill her medication metFORMIN  (GLUCOPHAGE -XR) 500 MG 24  and it looks like it's been discontinued and patient wants to know why and she's all out of the medication. Callback number [phone]

## 2023-12-29 NOTE — Telephone Encounter (Signed)
 Called patient back... spoke to the pharmacy as well to clarify sig.   Noted in the patient labs where PCP wanted to increase Metformin . Message below from lab result note. Will change in medication to reflect since PCP is out of the office.    A1C increased from 6.9 to 7.4 increasing metformin  to 500mg  twice daily. Monitor carbohydrates -rice, potatoes, tortillas, [place] Corn Masa Flour, breads, pasta, sweets, sodas.  Increase exercising to help maintain appropriate weight.  Kidney function is normal. Cholesterol levels are normal  Rest of your labs are normal

## 2023-12-29 NOTE — Telephone Encounter (Signed)
 Copied from CRM [id number]. Topic: Clinical - Prescription Issue >> Dec 28, 2023  4:52 PM Winona R wrote: Pt states she is currently out of her rx as her dose has increased to two a day by Rosaline Bohr but her rx says one a day. Pt needs this updated before requesting another refill.  metFORMIN  (GLUCOPHAGE -XR) 500 MG 24 hr tablet [[id number]]

## 2024-01-14 ENCOUNTER — Ambulatory Visit (INDEPENDENT_AMBULATORY_CARE_PROVIDER_SITE_OTHER): Payer: Self-pay | Admitting: Primary Care

## 2024-01-26 ENCOUNTER — Other Ambulatory Visit (INDEPENDENT_AMBULATORY_CARE_PROVIDER_SITE_OTHER): Payer: Self-pay | Admitting: Primary Care

## 2024-01-26 DIAGNOSIS — E119 Type 2 diabetes mellitus without complications: Secondary | ICD-10-CM

## 2024-02-11 ENCOUNTER — Ambulatory Visit (INDEPENDENT_AMBULATORY_CARE_PROVIDER_SITE_OTHER): Admitting: Primary Care

## 2024-02-29 ENCOUNTER — Other Ambulatory Visit (INDEPENDENT_AMBULATORY_CARE_PROVIDER_SITE_OTHER): Payer: Self-pay | Admitting: Primary Care

## 2024-02-29 DIAGNOSIS — E119 Type 2 diabetes mellitus without complications: Secondary | ICD-10-CM

## 2024-03-10 ENCOUNTER — Encounter (INDEPENDENT_AMBULATORY_CARE_PROVIDER_SITE_OTHER): Payer: Self-pay | Admitting: Primary Care

## 2024-03-10 ENCOUNTER — Ambulatory Visit (INDEPENDENT_AMBULATORY_CARE_PROVIDER_SITE_OTHER): Payer: Self-pay | Admitting: Primary Care

## 2024-03-10 VITALS — BP 114/81 | HR 94 | Resp 16 | Wt 158.0 lb

## 2024-03-10 DIAGNOSIS — R10A3 Flank pain, bilateral: Secondary | ICD-10-CM

## 2024-03-10 DIAGNOSIS — Z7984 Long term (current) use of oral hypoglycemic drugs: Secondary | ICD-10-CM

## 2024-03-10 DIAGNOSIS — I10 Essential (primary) hypertension: Secondary | ICD-10-CM

## 2024-03-10 DIAGNOSIS — E782 Mixed hyperlipidemia: Secondary | ICD-10-CM

## 2024-03-10 DIAGNOSIS — E119 Type 2 diabetes mellitus without complications: Secondary | ICD-10-CM

## 2024-03-10 LAB — POCT GLYCOSYLATED HEMOGLOBIN (HGB A1C): HbA1c, POC (controlled diabetic range): 7.4 % — AB (ref 0.0–7.0)

## 2024-03-10 MED ORDER — ATORVASTATIN CALCIUM 10 MG PO TABS: 10.0000 mg | ORAL_TABLET | Freq: Every day | ORAL | 1 refills | Status: AC

## 2024-03-10 MED ORDER — SITAGLIPTIN PHOSPHATE 100 MG PO TABS: 100.0000 mg | ORAL_TABLET | Freq: Every day | ORAL | 1 refills | Status: AC

## 2024-03-10 MED ORDER — LISINOPRIL 20 MG PO TABS: 20.0000 mg | ORAL_TABLET | Freq: Every day | ORAL | 1 refills | Status: DC

## 2024-03-10 MED ORDER — AMLODIPINE BESYLATE 10 MG PO TABS: 10.0000 mg | ORAL_TABLET | Freq: Every day | ORAL | 1 refills | Status: AC

## 2024-03-10 MED ORDER — METFORMIN HCL ER 500 MG PO TB24: 500.0000 mg | ORAL_TABLET | Freq: Two times a day (BID) | ORAL | 1 refills | Status: DC

## 2024-03-10 NOTE — Progress Notes (Unsigned)
 Subjective:  Patient ID: Yvette Perez, female    DOB: 07/03/1990  Age: 33 y.o. MRN: [id number]  CC: Diabetes   [name] presents for Follow-up of diabetes. Patient does not check blood sugar at home She expressed metformin  made her stomach hurt and consent use of the bathroom at work. Diabetes  Patient expresses concern about bilateral flank pain and she is aware of ovarian cysts being found on ultrasound and would like to be reevaluated.  Pain level is 7 out of 10 not daily but enough to interfere with daily life activities.  She explained the pain was like when she had kidney stones that is why she is concerned and knows is not a urinary tract infection or kidney stones present.  Compliant with meds - Yes Checking CBGs? No  Fasting avg -   Postprandial average -  Exercising regularly? - No Watching carbohydrate intake? - No Neuropathy ? - No Hypoglycemic events - No  - Recovers with :   Pertinent ROS:  Polyuria - No Polydipsia - No Vision problems - No  Medications as noted below. Taking them regularly without complication/adverse reaction being reported today.   History Karilynn has a past medical history of Medical history non-contributory, Renal disorder, and UTI (urinary tract infection).   She has a past surgical history that includes No past surgeries.   Her family history is not on file.She reports that she has been smoking cigars and cigarettes. She has never used smokeless tobacco. She reports that she does not drink alcohol and does not use drugs.  Medications Ordered Prior to Encounter[1]  Review of Systems Comprehensive ROS Pertinent positive and negative noted in HPI   Objective:  BP 114/81   Pulse 94   Resp 16   Wt 158 lb (71.7 kg)   SpO2 98%   BMI 28.90 kg/m   BP Readings from Last 3 Encounters:  03/10/24 114/81  11/20/23 (!) 95/58  10/14/23 117/84    Wt Readings from Last 3 Encounters:  03/10/24 158 lb (71.7 kg)  11/20/23 155 lb (70.3 kg)   10/14/23 155 lb 9.6 oz (70.6 kg)    Physical Exam Vitals reviewed.  Constitutional:      Appearance: Normal appearance. She is obese.  HENT:     Head: Normocephalic.     Right Ear: Tympanic membrane, ear canal and external ear normal.     Left Ear: Tympanic membrane, ear canal and external ear normal.     Nose: Nose normal.     Mouth/Throat:     Mouth: Mucous membranes are moist.  Eyes:     Extraocular Movements: Extraocular movements intact.     Pupils: Pupils are equal, round, and reactive to light.  Cardiovascular:     Rate and Rhythm: Normal rate.  Pulmonary:     Effort: Pulmonary effort is normal.     Breath sounds: Normal breath sounds.  Abdominal:     General: Bowel sounds are normal.     Palpations: Abdomen is soft.  Musculoskeletal:        General: Normal range of motion.     Cervical back: Normal range of motion.  Skin:    General: Skin is warm and dry.  Neurological:     Mental Status: She is alert and oriented to person, place, and time.  Psychiatric:        Mood and Affect: Mood normal.        Behavior: Behavior normal.  Thought Content: Thought content normal.     Lab Results  Component Value Date   HGBA1C 7.4 (A) 03/10/2024   HGBA1C 7.4 (H) 10/14/2023   HGBA1C 6.9 (H) 06/04/2023    Lab Results  Component Value Date   WBC 10.8 (H) 11/20/2023   HGB 12.2 11/20/2023   HCT 37.2 11/20/2023   PLT 351 11/20/2023   GLUCOSE 106 (H) 11/20/2023   CHOL 147 10/14/2023   TRIG 119 10/14/2023   HDL 42 10/14/2023   LDLCALC 84 10/14/2023   ALT 11 10/14/2023   AST 9 10/14/2023   NA 139 11/20/2023   K 3.7 11/20/2023   CL 101 11/20/2023   CREATININE 0.78 11/20/2023   BUN 12 11/20/2023   CO2 22 11/20/2023   TSH 1.000 06/04/2023   HGBA1C 7.4 (A) 03/10/2024    Title   Diabetic Foot Exam - detailed    Semmes-Weinstein Monofilament Test + means has sensation and - means no sensation      Image components are not supported.   Image  components are not supported. Image components are not supported.  [place] Comments    Darcee was seen today for diabetes.  Diagnoses and all orders for this visit:  New onset type 2 diabetes mellitus (HCC) -     POCT glycosylated hemoglobin (Hb A1C) -     Lipid Panel D/C-     metFORMIN  (GLUCOPHAGE -XR) 500 MG 24 hr tablet; Take 1 tablet (500 mg total) by mouth 2 (two) times daily with a meal. Januvia  100mg  daily    Essential hypertension -     lisinopril  (ZESTRIL ) 20 MG tablet; Take 1 tablet (20 mg total) by mouth daily. -     amLODipine  (NORVASC ) 10 MG tablet; Take 1 tablet (10 mg total) by mouth daily.  Mixed hyperlipidemia -     Lipid Panel -     atorvastatin  (LIPITOR) 10 MG tablet; Take 1 tablet (10 mg total) by mouth daily.  Bilateral flank pain -     Ambulatory referral to Gynecology     Follow-up:  No follow-ups on file.  The above assessment and management plan was discussed with the patient. The patient verbalized understanding of and has agreed to the management plan. Patient is aware to call the clinic if symptoms fail to improve or worsen. Patient is aware when to return to the clinic for a follow-up visit. Patient educated on when it is appropriate to go to the emergency department.   Rosaline Bohr, NP-C      [1]  Current Outpatient Medications on File Prior to Visit  Medication Sig Dispense Refill   Accu-Chek Softclix Lancets lancets Use as instructed (Patient not taking: Reported on 06/04/2023) 100 each 12   amLODipine  (NORVASC ) 10 MG tablet Take 1 tablet by mouth once daily 90 tablet 1   atorvastatin  (LIPITOR) 10 MG tablet Take 1 tablet by mouth once daily 90 tablet 0   glucose blood (ACCU-CHEK GUIDE) test strip 1 each by Other route 2 (two) times daily. Use as instructed 100 each 12   lisinopril  (ZESTRIL ) 20 MG tablet Take 1 tablet by mouth once daily 90 tablet 1   metFORMIN  (GLUCOPHAGE -XR) 500 MG 24 hr tablet TAKE 1 TABLET BY MOUTH TWICE DAILY  WITH A MEAL 60 tablet 0   No current facility-administered medications on file prior to visit.

## 2024-03-11 ENCOUNTER — Ambulatory Visit (INDEPENDENT_AMBULATORY_CARE_PROVIDER_SITE_OTHER): Payer: Self-pay | Admitting: Primary Care

## 2024-03-11 LAB — LIPID PANEL
Chol/HDL Ratio: 4.6 ratio — ABNORMAL HIGH (ref 0.0–4.4)
Cholesterol, Total: 187 mg/dL (ref 100–199)
HDL: 41 mg/dL
LDL Chol Calc (NIH): 127 mg/dL — ABNORMAL HIGH (ref 0–99)
Triglycerides: 106 mg/dL (ref 0–149)
VLDL Cholesterol Cal: 19 mg/dL (ref 5–40)

## 2024-04-08 ENCOUNTER — Telehealth (INDEPENDENT_AMBULATORY_CARE_PROVIDER_SITE_OTHER): Payer: Self-pay

## 2024-04-08 NOTE — Telephone Encounter (Signed)
Will wait for provider response.

## 2024-04-08 NOTE — Telephone Encounter (Signed)
 Copied from CRM [id number]. Topic: Clinical - Medication Question >> Apr 08, 2024  9:33 AM Myrick T wrote: Reason for CRM: patient stated she  is taking  sitaGLIPtin  (JANUVIA ) 100 MG tablet and now having stomach pains and using the bathroom more often. Patient is asking for an alternate as the medication prescribe is between $600-$800 with insurance.

## 2024-04-11 ENCOUNTER — Other Ambulatory Visit: Payer: Self-pay

## 2024-04-11 ENCOUNTER — Encounter (INDEPENDENT_AMBULATORY_CARE_PROVIDER_SITE_OTHER): Admitting: Primary Care

## 2024-04-11 ENCOUNTER — Telehealth (INDEPENDENT_AMBULATORY_CARE_PROVIDER_SITE_OTHER): Payer: Self-pay

## 2024-04-11 ENCOUNTER — Encounter (INDEPENDENT_AMBULATORY_CARE_PROVIDER_SITE_OTHER): Payer: Self-pay

## 2024-04-11 MED ORDER — GLIPIZIDE ER 10 MG PO TB24
10.0000 mg | ORAL_TABLET | Freq: Every day | ORAL | 3 refills | Status: AC
  Filled 2024-04-11 – 2024-04-12 (×2): qty 90, 90d supply, fill #0

## 2024-04-11 MED ORDER — EMPAGLIFLOZIN 25 MG PO TABS
25.0000 mg | ORAL_TABLET | Freq: Every day | ORAL | 1 refills | Status: AC
  Filled 2024-04-11: qty 90, 90d supply, fill #0

## 2024-04-11 MED ORDER — GLIPIZIDE ER 10 MG PO TB24: 10.0000 mg | ORAL_TABLET | Freq: Every day | ORAL | 3 refills | Status: DC

## 2024-04-11 NOTE — Progress Notes (Signed)
 Erroneous visit was not told aware she was sent to [facility]

## 2024-04-11 NOTE — Telephone Encounter (Signed)
 Medication Samples have been provided to the patient.  Drug name: Pat   Strength: 2.5MG    Qty: 4 pens/1 box  LOT: [id number] Exp.Date: 04/25/2025  Dosing instructions: inject 2.5Mg  once a week   The patient has been instructed regarding the correct time, dose, and frequency of taking this medication, including desired effects and most common side effects.   Yvette Perez 4:55 PM 04/11/2024

## 2024-04-12 ENCOUNTER — Other Ambulatory Visit: Payer: Self-pay

## 2024-04-12 ENCOUNTER — Other Ambulatory Visit (HOSPITAL_BASED_OUTPATIENT_CLINIC_OR_DEPARTMENT_OTHER): Payer: Self-pay

## 2024-04-12 NOTE — Telephone Encounter (Signed)
 Will forward to provider

## 2024-04-19 NOTE — Telephone Encounter (Signed)
 Will forward to provider

## 2024-04-21 ENCOUNTER — Other Ambulatory Visit: Payer: Self-pay

## 2024-04-22 ENCOUNTER — Telehealth: Payer: Self-pay | Admitting: Primary Care

## 2024-04-23 ENCOUNTER — Other Ambulatory Visit (INDEPENDENT_AMBULATORY_CARE_PROVIDER_SITE_OTHER): Payer: Self-pay | Admitting: Primary Care

## 2024-04-23 DIAGNOSIS — I10 Essential (primary) hypertension: Secondary | ICD-10-CM

## 2024-04-25 ENCOUNTER — Ambulatory Visit (INDEPENDENT_AMBULATORY_CARE_PROVIDER_SITE_OTHER): Payer: Self-pay | Admitting: Primary Care

## 2024-05-11 ENCOUNTER — Ambulatory Visit: Payer: Self-pay | Admitting: Obstetrics and Gynecology
